# Patient Record
Sex: Female | Born: 1985 | Race: Black or African American | Hispanic: No | Marital: Single | Smoking: Former smoker
Health system: Southern US, Community
[De-identification: ages and names within clinical notes are randomized; demographics above are authoritative.]

## PROBLEM LIST (undated history)

## (undated) ENCOUNTER — Ambulatory Visit (HOSPITAL_COMMUNITY): Admission: EM | Payer: Self-pay

## (undated) ENCOUNTER — Inpatient Hospital Stay (HOSPITAL_COMMUNITY): Payer: Self-pay

## (undated) ENCOUNTER — Emergency Department: Payer: Self-pay | Source: Home / Self Care

## (undated) DIAGNOSIS — O98212 Gonorrhea complicating pregnancy, second trimester: Secondary | ICD-10-CM

## (undated) DIAGNOSIS — F17200 Nicotine dependence, unspecified, uncomplicated: Secondary | ICD-10-CM

## (undated) DIAGNOSIS — F1910 Other psychoactive substance abuse, uncomplicated: Secondary | ICD-10-CM

## (undated) DIAGNOSIS — F209 Schizophrenia, unspecified: Secondary | ICD-10-CM

## (undated) HISTORY — PX: BREAST ENHANCEMENT SURGERY: SHX7

## (undated) HISTORY — PX: OTHER SURGICAL HISTORY: SHX169

---
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
## 2007-10-06 ENCOUNTER — Ambulatory Visit (HOSPITAL_COMMUNITY): Admission: RE | Admit: 2007-10-06 | Discharge: 2007-10-06 | Payer: Self-pay | Admitting: Family Medicine

## 2007-10-25 ENCOUNTER — Emergency Department (HOSPITAL_COMMUNITY): Admission: EM | Admit: 2007-10-25 | Discharge: 2007-10-25 | Payer: Self-pay | Admitting: Emergency Medicine

## 2007-11-01 ENCOUNTER — Emergency Department (HOSPITAL_COMMUNITY): Admission: EM | Admit: 2007-11-01 | Discharge: 2007-11-01 | Payer: Self-pay | Admitting: Emergency Medicine

## 2007-11-04 ENCOUNTER — Emergency Department (HOSPITAL_COMMUNITY): Admission: EM | Admit: 2007-11-04 | Discharge: 2007-11-04 | Payer: Self-pay | Admitting: Emergency Medicine

## 2008-01-28 ENCOUNTER — Emergency Department (HOSPITAL_COMMUNITY): Admission: EM | Admit: 2008-01-28 | Discharge: 2008-01-28 | Payer: Self-pay | Admitting: Emergency Medicine

## 2008-02-02 ENCOUNTER — Other Ambulatory Visit: Admission: RE | Admit: 2008-02-02 | Discharge: 2008-02-02 | Payer: Self-pay | Admitting: Obstetrics and Gynecology

## 2008-05-04 ENCOUNTER — Emergency Department (HOSPITAL_COMMUNITY): Admission: EM | Admit: 2008-05-04 | Discharge: 2008-05-04 | Payer: Self-pay | Admitting: Emergency Medicine

## 2008-05-20 ENCOUNTER — Emergency Department (HOSPITAL_COMMUNITY): Admission: EM | Admit: 2008-05-20 | Discharge: 2008-05-21 | Payer: Self-pay | Admitting: Emergency Medicine

## 2009-08-18 ENCOUNTER — Emergency Department (HOSPITAL_COMMUNITY): Admission: EM | Admit: 2009-08-18 | Discharge: 2009-08-18 | Payer: Self-pay | Admitting: Emergency Medicine

## 2009-10-25 ENCOUNTER — Emergency Department (HOSPITAL_COMMUNITY): Admission: EM | Admit: 2009-10-25 | Discharge: 2009-10-25 | Payer: Self-pay | Admitting: Emergency Medicine

## 2010-01-01 ENCOUNTER — Emergency Department (HOSPITAL_COMMUNITY): Admission: EM | Admit: 2010-01-01 | Discharge: 2010-01-01 | Payer: Self-pay | Admitting: Emergency Medicine

## 2010-03-03 ENCOUNTER — Emergency Department (HOSPITAL_COMMUNITY): Admission: EM | Admit: 2010-03-03 | Discharge: 2010-03-03 | Payer: Self-pay | Admitting: Emergency Medicine

## 2010-05-07 ENCOUNTER — Emergency Department (HOSPITAL_COMMUNITY)
Admission: EM | Admit: 2010-05-07 | Discharge: 2010-05-08 | Disposition: A | Discharge status: Home / Self Care | Payer: Medicaid Other | Attending: Emergency Medicine | Admitting: Emergency Medicine

## 2010-05-07 DIAGNOSIS — N898 Other specified noninflammatory disorders of vagina: Secondary | ICD-10-CM | POA: Insufficient documentation

## 2010-06-17 LAB — URINALYSIS, ROUTINE W REFLEX MICROSCOPIC
Leukocytes, UA: NEGATIVE
Protein, ur: NEGATIVE mg/dL
Urobilinogen, UA: 0.2 mg/dL (ref 0.0–1.0)
pH: 6 (ref 5.0–8.0)

## 2010-06-17 LAB — CBC
HCT: 36.6 % (ref 36.0–46.0)
Hemoglobin: 13.1 g/dL (ref 12.0–15.0)
MCH: 32.1 pg (ref 26.0–34.0)
MCV: 89.7 fL (ref 78.0–100.0)
Platelets: 249 10*3/uL (ref 150–400)

## 2010-06-17 LAB — DIFFERENTIAL
Lymphocytes Relative: 43 % (ref 12–46)
Monocytes Relative: 7 % (ref 3–12)
Neutro Abs: 3.9 10*3/uL (ref 1.7–7.7)

## 2010-06-17 LAB — COMPREHENSIVE METABOLIC PANEL
AST: 14 U/L (ref 0–37)
Albumin: 3.4 g/dL — ABNORMAL LOW (ref 3.5–5.2)
Alkaline Phosphatase: 37 U/L — ABNORMAL LOW (ref 39–117)
BUN: 4 mg/dL — ABNORMAL LOW (ref 6–23)
Calcium: 8.5 mg/dL (ref 8.4–10.5)
Creatinine, Ser: 0.74 mg/dL (ref 0.4–1.2)
GFR calc Af Amer: >60 mL/min (ref >60)
GFR calc non Af Amer: >60 mL/min (ref >60)
Glucose, Bld: 76 mg/dL (ref 70–99)
Potassium: 3.2 mEq/L — ABNORMAL LOW (ref 3.5–5.1)
Sodium: 134 mEq/L — ABNORMAL LOW (ref 135–145)
Total Protein: 6.2 g/dL (ref 6.0–8.3)

## 2010-06-17 LAB — URINE MICROSCOPIC-ADD ON

## 2010-06-17 LAB — POCT PREGNANCY, URINE: Preg Test, Ur: POSITIVE

## 2010-06-21 LAB — COMPREHENSIVE METABOLIC PANEL
CO2: 25 mEq/L (ref 19–32)
Chloride: 107 mEq/L (ref 96–112)
GFR calc Af Amer: >60 mL/min (ref >60)
GFR calc non Af Amer: >60 mL/min (ref >60)
Sodium: 137 mEq/L (ref 135–145)
Total Protein: 7.1 g/dL (ref 6.0–8.3)

## 2010-06-21 LAB — DIFFERENTIAL
Basophils Absolute: 0.1 10*3/uL (ref 0.0–0.1)
Basophils Relative: 1 % (ref 0–1)
Eosinophils Absolute: 0.7 10*3/uL (ref 0.0–0.7)
Eosinophils Relative: 9 % — ABNORMAL HIGH (ref 0–5)
Lymphs Abs: 2.7 10*3/uL (ref 0.7–4.0)
Monocytes Absolute: 0.7 10*3/uL (ref 0.1–1.0)
Monocytes Relative: 9 % (ref 3–12)
Neutrophils Relative %: 46 % (ref 43–77)

## 2010-06-21 LAB — LIPASE, BLOOD: Lipase: 28 U/L (ref 11–59)

## 2010-06-21 LAB — URINALYSIS, ROUTINE W REFLEX MICROSCOPIC
Bilirubin Urine: NEGATIVE
Protein, ur: NEGATIVE mg/dL
Urobilinogen, UA: 0.2 mg/dL (ref 0.0–1.0)
pH: 6 (ref 5.0–8.0)

## 2010-06-21 LAB — CBC
HCT: 42.9 % (ref 36.0–46.0)
Hemoglobin: 14.4 g/dL (ref 12.0–15.0)
MCH: 31.7 pg (ref 26.0–34.0)
MCV: 94.1 fL (ref 78.0–100.0)
RDW: 13.9 % (ref 11.5–15.5)
WBC: 7.7 10*3/uL (ref 4.0–10.5)

## 2010-06-21 LAB — URINE CULTURE

## 2010-06-21 LAB — POCT PREGNANCY, URINE: Preg Test, Ur: NEGATIVE

## 2010-06-21 LAB — URINE MICROSCOPIC-ADD ON

## 2010-08-22 NOTE — Op Note (Signed)
NAMEDESTANEY, SARKIS           ACCOUNT NO.:  [id number]   MEDICAL RECORD NO.:  [id number]          PATIENT TYPE:  INP   LOCATION:  A401                          FACILITY:  [facility]   PHYSICIAN:  Langley Gauss, MD     DATE OF BIRTH:  Apr 11, 1985   DATE OF PROCEDURE:  09/02/2004  DATE OF DISCHARGE:                                 OPERATIVE REPORT   DIAGNOSES:  1.  A 39-week intrauterine pregnancy.  2.  Dysfunctional labor pattern requiring Pitocin augmentation secondary to      inadequate frequency and intensity of uterine contractions.  3.  Placement of continuous lumbar epidural analgesia.   PROCEDURE PERFORMED:  1.  Spontaneous-assisted vaginal delivery of a 6 pound, 7 ounce female      infant.  2.  Midline episiotomy and repair.   PHYSICIAN:  Langley Gauss, M.D.   SPECIMENS:  Arterial cord gas and cord blood to laboratory.  The placenta is  examined and noted to be apparently intact with a three-vessel umbilical  cord.   ESTIMATED BLOOD LOSS:  Less than 500 cc.   SUMMARY:  The patient had been scheduled for induction of labor on Sep 02, 2004, but she began having contractions in the late p.m. on Sep 01, 2004 and  presented to [facility] in the early a.m. of Sep 02, 2004.  She did  give a history of one episode of leakage of clear fluid at home, enough to  wet her mother's couch, but no additional leakage of fluid.  We did not  document rupture of membranes upon her presentation on Sep 02, 2004.  She  was observed throughout the evening and noted to have contraction pattern  every 3-7 minutes, somewhat mild in intensity.  In the a.m. of Sep 02, 2004,  an amniotomy was performed with fetal scalp electrodes unsuccessfully  placed.  Clear amniotic fluid is noted.  Subsequently with onset of active  labor pattern, the patient initially received IV Nubain and Phenergan, which  provided inadequate labor anesthesia; thus, she requested an epidural.  An  epidural was  placed without complications by Dr. Roylene Reason. Lisette Grinder, after  which time she was noted to be 5 cm dilated, completely effaced with a  vertex at a +1 station.  Subsequently had an excellent bilateral block in  place.  Progressed to complete dilatation, after which time she was placed  in a dorsal lithotomy position.  Prepped and draped in the usual sterile  manner.  Legs were completely numb, but she was able to push.  She pushed  well during the short second stage of labor.  With the stents in the  perineum, a small midline episiotomy was performed.  The infant was then  delivered in the direct OA position over the midline episiotomy without  extension.  Mouth and nares were bulb-suctioned of clear amniotic fluid.  Spontaneous rotation occurred through a right anterior shoulder position.  Gentle down-traction combined with expulsive efforts resulted in the  delivery of the infant's hands and shoulders beneath the pubic symphysis  without difficulty as well as the remainder of the  infant.  The umbilical  cord is then moved towards the infant.  The cord is doubly clamped and cut.  The infant is placed on the maternal abdomen for immediate bonding purposes.  Arterial cord gas and cord blood are then obtained.  Gentle traction on the  umbilical cord resulted in separation, which upon examination is noted to be  intact placenta with three-vessel umbilical cord.  Excellent uterine tone is  achieved by performing bimanual massage of the uterus.  Examination of the  following delivery reveals a midline episiotomy which has not extended.  This is easily repaired using 0 chromic in a running locked fashion on the  vaginal mucosa.  A two-layered closure of 0 chromic on the perineal body.  To restore the normal anatomy, the patient was then taken out of the dorsal  lithotomy position and rolled to her side, at which time the epidural  catheter was removed with the blue tip noted to be intact.  The  patient  herself is advised that the Foley catheter had been removed just prior to  delivery; thus, she should not have to get up for several hours to void.       DC/MEDQ  D:  09/02/2004  T:  09/02/2004  Job:  [id number]

## 2010-08-22 NOTE — Op Note (Signed)
NAMERAYELLE, ARMOR           ACCOUNT NO.:  [id number]   MEDICAL RECORD NO.:  [id number]          PATIENT TYPE:  INP   LOCATION:  LDR1                          FACILITY:  [facility]   PHYSICIAN:  Langley Gauss, MD     DATE OF BIRTH:  1986/03/24   DATE OF PROCEDURE:  09/02/2004  DATE OF DISCHARGE:                                 OPERATIVE REPORT   PROCEDURE:  Placement of continuous lumbar epidural analgesia at the L3-L4  interspace from performed by Dr. Roylene Reason. Lisette Grinder.   COMPLICATIONS:  None.   SUMMARY:  An appropriate informed consent was obtained.  The patient had  inadequate results from narcotics.  She requested epidural.  Risks and  benefits discussed with the patient.  The patient placed in a seated  position.  Bony landmarks were identified.  The L3-L4 interspace was chosen.  The patient's back is sterilely prepped and draped utilizing the epidural  tray, 5 mL of 1% lidocaine plain injected at the midline and the L3-L4  interspace to raise a small skin wheal.  A 17-gauge Tuohy-Schliff needle was  then utilized to loss resistance in an air-filled glass syringe to  atraumatically identify the epidural space on the first attempt without  difficulty.  Initial test dose 5 mL 1.5% lidocaine plus epinephrine injected  through the epidural needle.  No signs of CSF or intravascular injection  obtained.  Thus the epidural catheter was inserted to depth of 5 cm,  epidural needle was removed.  Aspiration test is negative.  Second test dose  2 mL 1.5% lidocaine plus epinephrine injected through the epidural catheter  itself.  Again no signs of CSF or intravascular injection obtained.  the  catheter is then secured in place.  The patient is returned to the lateral  supine position, at which time she does have evidence of a bilateral block  setting up.  The patient is thus connected to the infusion pump containing a  standard mixture.  She will be treated with a rate of 14 mL/hr.  Subsequently while watching the patient, she is noted to continued have  significant discomfort associated with the uterine contractions.  Thus she  received 5 mL of 2% lidocaine as a slowly-infused of bolus.  Examination  after this reveals cervix to be 5 cm dilated, +1 station and completely  effaced.  She continues to have a reassuring fetal heart rate.  Additional  leakage of clear amniotic fluid is noted with placement of the fetal scalp  electrode.       DC/MEDQ  D:  09/02/2004  T:  09/02/2004  Job:  [id number]

## 2010-08-22 NOTE — H&P (Signed)
NAMEJOBETH, [name]           ACCOUNT NO.:  [id number]   MEDICAL RECORD NO.:  [id number]          PATIENT TYPE:  INP   LOCATION:  LDR1                          FACILITY:  [facility]   PHYSICIAN:  Langley Gauss, MD     DATE OF BIRTH:  Sep 28, 1985   DATE OF ADMISSION:  09/02/2004  DATE OF DISCHARGE:  LH                                HISTORY & PHYSICAL   The patient is a 25 year old gravida 1, para 0 at [redacted] weeks gestation who had  scheduled induction of labor on Sep 03, 2004.  However, the late p.m. of Sep 01, 2004 patient contacted [facility] stating she began having  uterine contractions with tightening and loosening of the uterine wall.  She  was advised at that time to maintain modified bed rest and assess  contraction adequacy and frequency.  Notified that when contractions were  five minutes apart x2 hours' duration she should present to [facility].  Patient subsequently presented to [facility] at 0130 complaining of some  leakage of clear fluid.  This occurred on only one occasion and was only  enough to moisten a pillow.  She had no further leakage of fluid, no vaginal  bleeding following this.  Examination in labor and delivery at 0145 revealed  Nitrazine negative, vaginal discharge, no pooling of fluid, no leakage of  amniotic fluid.  Examination of cervix 1-2 cm dilated, 60% effaced, vertex  at a -1 station.  Initially on external toco patient is noted to have  uterine contractions anywhere from q.3 to q.7 minutes with a reassuring  fetal heart rate.   ASSESSMENT/PLAN:  Patient with scheduled induction of labor Sep 03, 2004 now  presents in early stages of active labor on Sep 02, 2004, thus she is  admitted early a.m. Sep 02, 2004.  Will proceed with amniotomy, therefore  augment or induce with Pitocin as clinically indicated.       DC/MEDQ  D:  09/02/2004  T:  09/02/2004  Job:  [id number]

## 2010-08-22 NOTE — Discharge Summary (Signed)
[name], [name]           ACCOUNT NO.:  [id number]   MEDICAL RECORD NO.:  [id number]          PATIENT TYPE:  INP   LOCATION:  A401                          FACILITY:  [facility]   PHYSICIAN:  Langley Gauss, MD     DATE OF BIRTH:  03-Mar-1986   DATE OF ADMISSION:  09/02/2004  DATE OF DISCHARGE:  06/01/2006LH                                 DISCHARGE SUMMARY   DATE OF EPIDURAL:  Sep 02, 2004.   DATE OF VAGINAL DELIVERY:  Sep 02, 2004.   DELIVERY FORM:  Spontaneous assisted vaginal delivery of a 6 pound 7 ounce  female infant, delivered over a midline episiotomy with repair.   The patient is given a copy of standard discharge instructions.  Followup in  the office in four weeks time.  She is bottle feeding at time of discharge.   DISCHARGE MEDICATIONS:  1.  Tylenol No. 3.  2.  Motrin for postpartum pain relief.   PERTINENT LABORATORY STUDIES:  Urine drug screen positive for THC, O  positive blood type, and admission hemoglobin/hematocrit 10.7/30.0 with a  white count of 8.4.  Postpartum day #1 9.8/28.1.   HOSPITAL COURSE:  The patient originally had induction of labor scheduled on  Sep 03, 2004; however, she presented very early a.m. about Sep 02, 2004  complaining of uterine contractions.  She was noted to be in prodromal  labor.  Amniotomy was performed.  Subsequently pitocin augmentation was  required.  She received  placement of epidural during the course of labor.  Progressed well along the labor curve.  Had an excellent bilateral block in  place, but when placed in the dorsal lithotomy position, she was able to  push well during the short second stage of labor.  Postpartum, the patient  has done well.  She has had no postpartum complications.  She has bonded  well with the infant.  She is bottle feeding.  She is discharged to home on  postpartum day #1-1/2.       DC/MEDQ  D:  09/04/2004  T:  09/04/2004  Job:  [id number]

## 2010-08-22 NOTE — Consult Note (Signed)
[name], Sandra           ACCOUNT NO.:  [id number]   MEDICAL RECORD NO.:  [id number]          PATIENT TYPE:  OIB   LOCATION:  A415                          FACILITY:  [facility]   PHYSICIAN:  Tilda Burrow, M.D. DATE OF BIRTH:  1985/09/15   DATE OF CONSULTATION:  06/01/2004  DATE OF DISCHARGE:  06/01/2004                                   CONSULTATION   OBSERVATION AND PROCEDURE NOTE:   CHIEF COMPLAINT:  Vulvar pain necessitating ambulance transport to hospital  at 24-1/2 weeks' gestation.   HISTORY OF PRESENT ILLNESS:  A 25 year old primiparous female, patient of  Langley Gauss, M.D., followed for pregnancy care through his office, who  presents via EMS transport after noticing left labial swelling and  discomfort this afternoon.  The patient is found on exam to have a large 3 x  2 x 2 cm cystic abscess structure in the interlabial crease on the left.  It  is just slightly anterior to the normal Bartholin's abscess position.  It is  felt to represent an atypical Bartholin's abscess or sebaceous cyst.  The  patient is otherwise fine but does complain of a mild whitish thick  discharge.   INCISION AND DRAINAGE:  The cyst is inspected.  Local infiltration of the  skin overlying it performed.  Efforts to see if it can be accessed through  from inside the hymen remnants reveal that it would not be possible to  access it.  After infiltrating with 3 mL of Xylocaine with epinephrine, a 15  blade stab incision was made in the inferiormost portion of the abscess.  A  small amount of purulence and thick, clumpy sebaceous material consistent  with a sebaceous cyst were encountered.  Inadequate drainage from the  inferior stab incision was achieved, so additional infiltration directly  over the interlabial crease on the left was performed and then an X-shaped  stab incision performed directly over the sebaceous cyst, which allowed  expression of large chunks of sebaceous debris.  A  one-half inch iodoform  wick was inserted into the site.  The patient was given Keflex 500 mg q.i.d.  x7 days and Tylenol No. 3 for pain and instructed to use ice pack p.r.n.,  follow-up 48 hours with Dr. Lisette Grinder for removal if the wick if it does not  spontaneously fall out before then.  Ice packs to be used for the first few  hours, then after that hot tub soaks and massage as much as tolerated by the  patient will be recommended.  Follow-up, as stated, in 48 hours.     JVF/MEDQ  D:  06/01/2004  T:  06/02/2004  Job:  [id number]   cc:   Langley Gauss, MD  [address]  [place]  [place] [address]  Fax: [phone]

## 2010-08-22 NOTE — Discharge Summary (Signed)
[name], [name]           ACCOUNT NO.:  [id number]   MEDICAL RECORD NO.:  [id number]          PATIENT TYPE:  OBV   LOCATION:  A415                          FACILITY:  [facility]   PHYSICIAN:  Langley Gauss, MD     DATE OF BIRTH:  03/29/1986   DATE OF ADMISSION:  05/14/2004  DATE OF DISCHARGE:  02/08/2006LH                                 DISCHARGE SUMMARY   This is a 25 year old gravida 1, para 0 [name] at  [redacted] weeks gestation who presents to [facility] with the complaint of  abdominal pain x3 days' duration and leaking fluid.  I was specifically  initially contacted by nursing staff on an urgent basis with the only  history obtained by them was that patient was leaking fluid vaginally, her  water was broke, Nitrazine was positive, and she had been examined by a  member of the nursing staff who noted bulging membranes and patient was  acting and grunting as though she felt pressure and wanted to push.   Patient does have a history of asthma.  Prior to my arrival she had received  an albuterol respiratory treatment and was on O2 and much calmer than upon  her initial presentation.   The complete history as obtained by myself is that patient was at home in  her high school drop-out usual unemployed status sitting on the couch.  Finally had the ambition with a full bladder to get up and pee but then she  specifically told me that she was unable to make it to the bathroom and  noted feeling very wet in her undergarments and the upper portion of her  blue jeans.  She states that when she sat down to urinate on the pot she  only had yellow mucousy discharge.  At that point in time her mother  contacted the office and stated that the patient was on her way to the  hospital leaking fluid.   REVIEW OF SYSTEMS:  Pertinent for no significant change in vaginal discharge  prior to this.  She has had three days of vague suprapubic abdominal pain,  but no  definite history of uterine contractions.  She denies any known STD  exposures.  She does describe good fetal movement.  Patient is able to  provide the history that she had an ultrasound done three weeks ago at which  time the baby was noted to be what she described as bleach presentation.  Patient has no idea what the implications and meaning of this at [redacted] weeks  gestation.  Otherwise, ultrasound was normal with normal anatomic survey.  Her prenatal course is reviewed.  She is noted to be O+ blood type.  AFP  triple screen within normal limits.  RPR nonreactive.  Hemoglobin 11.8,  hematocrit 35.5.  She is noted to have late onset of care, initial visit at  15+ weeks gestation at which time she was seen at [facility].   PAST MEDICAL HISTORY:  Pertinent for a history of asthma.  She uses an  inhaler.  She also has used Singulair previously and  has previously used  Clarinex.  Patient did stop Clarinex with findings of a positive UCG.  Patient does claim that she became pregnant with oral contraceptive failure  but I highly doubt that she was taking oral contraceptives appropriately.  Past medical history is otherwise negative.   ALLERGIES:  She does state she is allergic to PENICILLIN with unknown  reaction.   SOCIAL HISTORY:  High school drop-out.  Father of the baby is not involved.  Comes from a large [place] family here in local [place] area.  Presumably this was an unplanned pregnancy as patient contends that this was  oral contraceptive failure.   FAMILY HISTORY:  Noncontributory.   PHYSICAL EXAMINATION:  VITAL SIGNS:  Height 5 feet 8 inches, pre pregnancy  weight is 120.  Most recent weight is 136.  Patient's vital signs not seen  on intake triage sheet.  Last visit to the office blood pressure 107/57,  pulse rate of 80.  Today's visit to labor and delivery respiratory rate is  noted to be nonlabored with respirations 16-20.  Blood pressure on external   fetal monitor sheet 140/77, pulse of 86.  HEENT:  Negative.  NECK:  Supple.  Thyroid is nonpalpable.  LUNGS:  Clear.  CARDIOVASCULAR:  Regular rate and rhythm.  ABDOMEN:  Soft and nontender.  Gravid uterus identified with the fundal  height noted to be two fingerbreadths above the umbilicus.  EXTREMITIES:  Normal.  PELVIC:  Normal external genitalia.  There is noted to be moisture noted  which undoubtedly is due to a digital examination done previously and use of  lubricant.  External speculum examination performed by myself reveals cervix  to be closed, 4 cm long, no leakage of fluid, no vaginal bleeding.  No  significant heavy discharge is identified.  GC and Chlamydia cultures  performed from the endocervix.  Specimen taken from posterior vaginal  fornix.  Wet prep is performed as well as fern test.  External fetal monitor  reveals fetal heart rate baseline 140s.  No uterine activity is identified  other than maternal movement.  There was no evidence of any leakage of fluid  in the bed or by patient history since arrival here on labor and delivery.   LABORATORIES:  Urine drug test is performed which is positive for cocaine.  Of note, cocaine in the urine is positive for only two to three days  following use.  Patient is also noted to be positive for THC.  These results  will be discussed with the patient at follow-up visit to protect her  confidentiality as during the visit here in labor and delivery excess number  of family members are present in the room numbering four to six.  A limited  OB ultrasound greater than [redacted] weeks gestation is performed and interpreted  by Dr. Roylene Reason. Lisette Grinder which reveals a single intrauterine pregnancy,  viable.  Fetal cardiac activity is noted 150 beats per minute.  Double  footling breech presentation.  Normal amniotic fluid volume subjectively.  In addition, amniotic fluid index is obtained with pockets of 4.7, 4.4, 7.2, and 3 for a total of AFI of  19.  Growth parameters are all consistent with  22 weeks estimated gestational age.  Good fetal movement is identified.  Pertinently, double footling breech presentation is noted.  The patient's  bladder is noted to be slightly distended and fetal movement specifically  kicking is noted to directly kick on the patient's bladder.  The GC and  Chlamydia  cultures are currently pending.  The wet prep is visualized which  reveals presence of __________ Trichomonas.   ASSESSMENT/PLAN:  1.  Urinary incontinence as result of the pregnancy.  2.  Trichomonas vaginitis.  3.  Cocaine use disorder.   PLAN:  Patient is given prescription for Flagyl 500 mg p.o. b.i.d. x7 days  to treat the Trichomonas.  She is advised to get dressed and ambulate here  at [facility] and notify us if any leakage of fluid does occur.  Discussed  with her the urinary leakage and efforts should be made by her to avoid  hyperdistention of the bladder in light of the double footling breech  presentation which could result in additional urinary leakage.  Signs and  symptoms of labor as well  as spontaneous rupture of membranes reviewed with the patient.  She is  advised to keep her next prenatal visit and as stated previously, to protect  her confidentiality and her current medical status, the positive cocaine and  THC will be discussed in the absence of other family members.  Patient is  discharged home on today's date, May 14, 2004.      DC/MEDQ  D:  05/14/2004  T:  05/15/2004  Job:  [id number]
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
## 2010-08-22 NOTE — H&P (Signed)
NAMESHARLENE, [name]           ACCOUNT NO.:  [id number]   MEDICAL RECORD NO.:  [id number]          PATIENT TYPE:  INP   LOCATION:  LDR1                          FACILITY:  [facility]   PHYSICIAN:  Langley Gauss, MD     DATE OF BIRTH:  20-May-1985   DATE OF ADMISSION:  DATE OF DISCHARGE:  LH                                HISTORY & PHYSICAL   The date of scheduled induction is __________.  The patient is to come at  0800.  We will proceed with amniotomy, thereafter augmentor-induced labor as  clinically indicated.  The patient is a 24 year old gravida 1, para 0 at 61-  1/[redacted] weeks gestation, currently receiving homebound care through [employer].  The cervix is favorable for induction, and we will induce her  for psychosocial factors.  In addition, she is a chronic asthmatic,  requiring fairly aggressive treatment during this time of the year.   PRENATAL COURSE:  She is known to be O positive blood type.  RPR is  nonreactive, rubella immune.  AFP triple screen is within normal limits.  She has had serial ultrasounds which have documented adequate fetal growth,  most recently Aug 08, 2004 at 34-6/[redacted] weeks gestation.  Ultrasound revealed  appropriate interval growth compared to previous ultrasounds, normal  amniotic fluid volume and good fetal movement.  The patient did present late  for prenatal care with initial visit past 15 weeks.   PAST MEDICAL HISTORY:  She does have asthma.  She uses an inhaler.  Previously she has utilized both [employer] fast streamer.  She did stop using  Clarinex with a positive pregnancy test but was advised that this would be  an acceptable medication to be utilized during the pregnancy.  She states  she is allergic to penicillin with an unknown reaction.   SOCIAL HISTORY:  The father of the baby is uninvolved.  The patient is a  nonsmoker.  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  Home phone number is  [phone].   PHYSICAL EXAMINATION:  GENERAL APPEARANCE:  A pleasant black female with  height 5 feet 8 inches, 120 pounds, 145 pounds  most recently.  VITAL SIGNS:  Blood pressure 102/59, pulse rate 80, respiratory rate 20.  HEENT:  Negative.  No adenopathy.  NECK:  Supple.  Thyroid is nonpalpable.  LUNGS:  Clear.  CARDIOVASCULAR:  Exam has regular rate and rhythm.  ABDOMEN:  Soft and nontender.  No surgical scars are identified.  She does  have protuberance of the umbilicus.  Vertex presentation by [employer].  Fundal height of 34 cm.  EXTREMITIES:  Are noted to be normal.  PELVIC:  Normal external genitalia.  No lesions or ulcerations are  identified.  Cervix is 2 cm dilated.  Vertex is zero station and about 60%  effaced.  The vertex is easily palpable.  The patient had complaint of 3  days' duration of clear fluid running down her leg.  She did not notify  anyone regarding these concerns.  On examination today she is noted to have  a bubbly  discharge present which is visible, but no amniotic fluid is noted.  GC and chlamydia cultures are performed as well as a wet prep.   ASSESSMENT:  A 39-week intrauterine pregnancy, medically indicated induction  of labor with a very favorable cervix.   The patient is to be referred, at which time amniotomy will be performed.  Subsequently she will receive Pitocin augmentation or induction as  clinically indicated.       DC/MEDQ  D:  08/28/2004  T:  08/28/2004  Job:  [id number]

## 2011-01-02 LAB — WET PREP, GENITAL: Yeast Wet Prep HPF POC: NONE SEEN

## 2011-01-02 LAB — DIFFERENTIAL
Basophils Absolute: 0.1
Basophils Relative: 1
Eosinophils Relative: 4
Lymphocytes Relative: 44
Neutro Abs: 2.6

## 2011-01-02 LAB — URINALYSIS, ROUTINE W REFLEX MICROSCOPIC
Bilirubin Urine: NEGATIVE
Ketones, ur: NEGATIVE
Nitrite: NEGATIVE
Protein, ur: NEGATIVE
Urobilinogen, UA: 0.2

## 2011-01-02 LAB — BASIC METABOLIC PANEL
BUN: 4 — ABNORMAL LOW
Calcium: 9.2
GFR calc non Af Amer: >60
Glucose, Bld: 92

## 2011-01-02 LAB — CBC
MCHC: 33.4
Platelets: 292
RDW: 15

## 2011-01-02 LAB — PREGNANCY, URINE: Preg Test, Ur: NEGATIVE

## 2011-01-24 ENCOUNTER — Emergency Department (HOSPITAL_COMMUNITY)
Admission: EM | Admit: 2011-01-24 | Discharge: 2011-01-24 | Disposition: A | Discharge status: Home / Self Care | Payer: Medicaid Other | Attending: Emergency Medicine | Admitting: Emergency Medicine

## 2011-01-24 ENCOUNTER — Encounter: Payer: Self-pay | Admitting: *Deleted

## 2011-01-24 DIAGNOSIS — L089 Local infection of the skin and subcutaneous tissue, unspecified: Secondary | ICD-10-CM | POA: Insufficient documentation

## 2011-01-24 DIAGNOSIS — L7682 Other postprocedural complications of skin and subcutaneous tissue: Secondary | ICD-10-CM

## 2011-01-24 DIAGNOSIS — Z87891 Personal history of nicotine dependence: Secondary | ICD-10-CM | POA: Insufficient documentation

## 2011-01-24 DIAGNOSIS — R209 Unspecified disturbances of skin sensation: Secondary | ICD-10-CM | POA: Insufficient documentation

## 2011-01-24 NOTE — ED Provider Notes (Signed)
History   This chart was scribed for Felisa Bonier, MD by Clarita Crane. The patient was seen in room APA12/APA12 and the patient's care was started at 10:25AM.   CSN: [id number] Arrival date & time: 01/24/2011  9:53 AM   First MD Initiated Contact with Patient 01/24/11 [phone]      Chief Complaint  Patient presents with  . Recurrent Skin Infections    HPI [name] is a 25 y.o. female who presents to the Emergency Department complaining of area of moderate inflammation described as a boil to right axillary region onset 5 days ago and persistent since with associated pain. Patient reports having bilateral breast augmentation surgery performed recently and was started on abx. Patient denies drainage from area of inflammation. Patient states that she has an appointment with plastic surgeon 2 days from now  History reviewed. No pertinent past medical history.  Past Surgical History  Procedure Date  . Breat implants     History reviewed. No pertinent family history.  History  Substance Use Topics  . Smoking status: Former Games developer  . Smokeless tobacco: Not on file  . Alcohol Use: No    OB History    Grav Para Term Preterm Abortions TAB SAB Ect Mult Living                  Review of Systems 10 Systems reviewed and are negative for acute change except as noted in the HPI.  Allergies  Penicillins  Home Medications  No current outpatient prescriptions on file.  BP 119/77  Pulse 103  Temp(Src) 97.2 F (36.2 C) (Oral)  Resp 20  Ht 5\' 9"  (1.753 m)  Wt 135 lb (61.236 kg)  BMI 19.94 kg/m2  SpO2 100%  LMP 01/05/2011  Physical Exam  Nursing note and vitals reviewed. Constitutional: She is oriented to person, place, and time. She appears well-developed and well-nourished. No distress.  HENT:  Head: Normocephalic and atraumatic.  Eyes: EOM are normal. Pupils are equal, round, and reactive to light.  Neck: Neck supple. No tracheal deviation present.    Cardiovascular: Normal rate.   Pulmonary/Chest: Effort normal. No respiratory distress.       Well healing incision in right axilla with underlying swelling but no overlying erythema or drainage to suggest infection. No abscess to drain.   Abdominal: She exhibits no distension.  Musculoskeletal: Normal range of motion. She exhibits no edema.  Neurological: She is alert and oriented to person, place, and time. No sensory deficit.  Skin: Skin is warm and dry.  Psychiatric: She has a normal mood and affect. Her behavior is normal.    ED Course  Procedures (including critical care time)  DIAGNOSTIC STUDIES: Oxygen Saturation is 100% on room air, normal by my interpretation.    COORDINATION OF CARE:    Labs Reviewed - No data to display No results found.   No diagnosis found.    MDM  The patient appears to have some mild inflammation at the incision site from her recent breast implant at the right axilla. There is no erythema overlying it, and I think this may just be true and innocent inflammation from the suture site. At this time I am not inclined to make an incision to try and drain any abscess, as I don't think there is abscess there. The swelling is firm and not fluctuant and there is no overlying erythema to suggest infection. Furthermore, the patient is on an antibiotics prescribed by the surgeon who performed  the procedure, and she is due to followup with that surgeon in 2 days regarding the swelling in the right axilla. I will defer further evaluation and treatment to the surgeon who is treating her. The patient states her understanding of and agreement with this plan of care.     I personally performed the services described in this documentation, which was scribed in my presence. The recorded information has been reviewed and considered.    Felisa Bonier, MD 01/24/11 [phone]

## 2011-01-24 NOTE — ED Notes (Signed)
Pt c/o boil to under right arm x 5 days ago

## 2011-01-25 ENCOUNTER — Emergency Department (HOSPITAL_COMMUNITY)
Admission: EM | Admit: 2011-01-25 | Discharge: 2011-01-25 | Disposition: A | Discharge status: Home / Self Care | Payer: Medicaid Other | Attending: Emergency Medicine | Admitting: Emergency Medicine

## 2011-01-25 ENCOUNTER — Encounter (HOSPITAL_COMMUNITY): Payer: Self-pay

## 2011-01-25 DIAGNOSIS — Z978 Presence of other specified devices: Secondary | ICD-10-CM | POA: Insufficient documentation

## 2011-01-25 DIAGNOSIS — Z87891 Personal history of nicotine dependence: Secondary | ICD-10-CM | POA: Insufficient documentation

## 2011-01-25 DIAGNOSIS — N644 Mastodynia: Secondary | ICD-10-CM | POA: Insufficient documentation

## 2011-01-25 NOTE — ED Notes (Signed)
Had breast implants oct.11, was seen yesterday for ?boil to right breast, wants area rechecked, "stitches swollen", "implant feels bigger"

## 2011-01-25 NOTE — ED Provider Notes (Signed)
Scribed for Donnetta Hutching, MD, the patient was seen in room APA02/APA02 . This chart was scribed by Ellie Lunch.   CSN: [id number] Arrival date & time: 01/25/2011  8:18 PM   First MD Initiated Contact with Patient 01/25/11 2046      Chief Complaint  Patient presents with  . Wound Check    had breast implants oct.11    (Consider location/radiation/quality/duration/timing/severity/associated sxs/prior treatment) HPI [name] is a 25 y.o. female who presents to the Emergency Department for a wound check. Pt received breast implants 01/15/2011 from Dr. Ellery Plunk in [place] and is concerned that she may have an infection. Pt was seen in ED yesterday for swelling at incision site and swelling in inferior aspect of right breast. Pt discharged and returned today for additional swelling in inferior aspect of right breast. Pt also reports increased pain on right side, but notes that additional swelling is non tender. Also c/o subjective fever. Denies soreness in left breast  Pt has follow up with surgeon tomorrow. Pt is on antibiotic.   History reviewed. No pertinent past medical history.  Past Surgical History  Procedure Date  . Breat implants     No family history on file.  History  Substance Use Topics  . Smoking status: Former Games developer  . Smokeless tobacco: Not on file  . Alcohol Use: No    Review of Systems 10 Systems reviewed and are negative for acute change except as noted in the HPI.  Allergies  Penicillins  Home Medications   Current Outpatient Rx  Name Route Sig Dispense Refill  . CEPHALEXIN 500 MG PO CAPS Oral Take 1,000 mg by mouth 4 (four) times daily. Patient started antibiotic on 01/22/11, should be a 5 day regimen       BP 118/63  Pulse 90  Temp(Src) 99.2 F (37.3 C) (Oral)  Resp 16  Ht 5\' 9"  (1.753 m)  Wt 135 lb (61.236 kg)  BMI 19.94 kg/m2  SpO2 100%  LMP 01/24/2011  Physical Exam  Nursing note and vitals reviewed. Constitutional: She is  oriented to person, place, and time. She appears well-developed and well-nourished.  HENT:  Head: Normocephalic and atraumatic.  Eyes: Conjunctivae and EOM are normal.  Neck: Normal range of motion.  Cardiovascular: Normal rate and regular rhythm.   Pulmonary/Chest: Effort normal. No respiratory distress.        Asymmetry of two breasts.  Inferior aspect of r. breast non tender. No erythema . No sign of cellulitis. Area of induration in right axilla approximately 0.75 x 2 CM. Patient says this has not changed in character in past 24 hour  Abdominal: Soft. There is no tenderness.  Musculoskeletal: Normal range of motion.  Neurological: She is alert and oriented to person, place, and time.  Skin: Skin is warm and dry.  Psychiatric: She has a normal mood and affect.   ED Course  Procedures (including critical care time)  OTHER DATA REVIEWED: Nursing notes, vital signs, and past medical records reviewed.  DIAGNOSTIC STUDIES: Oxygen Saturation is 100% on room air, normal by my interpretation.    1. Breast pain     9:12 PM EDP at PT bedside. Discussed with PT PE findings. Swelling to inferior aspect of right breast is non tender with no erythema or signs of cellulitis. Discussed that this does not indicate infection. Discussed plan to discharge with instructions to continue taking abx and follow up with Surgeon at tomorrow's appointment.   MDM  Right breast shows no evidence  of infection. Patient points to inferior pole, however there is no tenderness or erythema.  Perhaps a small amount of edema.  She is taking cephalexin. No obvious pus pocket. Has appointment with plastic surgeon tomorrow.  I personally performed the services described in this documentation, which was scribed in my presence. The recorded information has been reviewed and considered.         Donnetta Hutching, MD 01/25/11 2117

## 2011-03-18 ENCOUNTER — Encounter (HOSPITAL_COMMUNITY): Payer: Self-pay | Admitting: *Deleted

## 2011-03-18 ENCOUNTER — Emergency Department (HOSPITAL_COMMUNITY)
Admission: EM | Admit: 2011-03-18 | Discharge: 2011-03-18 | Disposition: A | Discharge status: Home / Self Care | Payer: Medicaid Other | Attending: Emergency Medicine | Admitting: Emergency Medicine

## 2011-03-18 DIAGNOSIS — Q839 Congenital malformation of breast, unspecified: Secondary | ICD-10-CM

## 2011-03-18 DIAGNOSIS — N649 Disorder of breast, unspecified: Secondary | ICD-10-CM | POA: Insufficient documentation

## 2011-03-18 DIAGNOSIS — Z978 Presence of other specified devices: Secondary | ICD-10-CM | POA: Insufficient documentation

## 2011-03-18 DIAGNOSIS — Z87891 Personal history of nicotine dependence: Secondary | ICD-10-CM | POA: Insufficient documentation

## 2011-03-18 NOTE — ED Notes (Signed)
Pt reports feeling like she has a leak in her implants.  Reports feeling like fluid is moving across top part of chest. First noticed sensation yesterday.

## 2011-03-18 NOTE — ED Provider Notes (Signed)
History     CSN: [id number] Arrival date & time: 03/18/2011  5:31 AM   None     No chief complaint on file.   (Consider location/radiation/quality/duration/timing/severity/associated sxs/prior treatment) The history is provided by the patient.   patient is a 25 year old female status post saline breast implants by plastic surgery in [place] in October. Patient presents was current concern for saline leakage from the right breast area into subcutaneous tissue. Denies any change in size of the implant. No other complaints.  Past Medical History  Diagnosis Date  . Asthma     Past Surgical History  Procedure Date  . Breat implants   . Breast enhancement surgery     History reviewed. No pertinent family history.  History  Substance Use Topics  . Smoking status: Former Games developer  . Smokeless tobacco: Not on file  . Alcohol Use: No    OB History    Grav Para Term Preterm Abortions TAB SAB Ect Mult Living                  Review of Systems  Constitutional: Negative for fever.  HENT: Negative for neck pain.   Eyes: Negative for redness.  Respiratory: Negative for cough and shortness of breath.   Cardiovascular: Negative for chest pain.  Gastrointestinal: Negative for abdominal pain.  Genitourinary: Negative for dysuria.  Musculoskeletal: Negative for back pain.  Skin: Negative for rash.  Neurological: Negative for headaches.    Allergies  Penicillins  Home Medications   Current Outpatient Rx  Name Route Sig Dispense Refill  . CEPHALEXIN 500 MG PO CAPS Oral Take 1,000 mg by mouth 4 (four) times daily. Patient started antibiotic on 01/22/11, should be a 5 day regimen       BP 117/73  Pulse 76  Temp 97.3 F (36.3 C)  Resp 20  Ht 5\' 9"  (1.753 m)  Wt 140 lb (63.504 kg)  BMI 20.67 kg/m2  SpO2 100%  LMP 02/18/2011  Physical Exam  Nursing note and vitals reviewed. Constitutional: She is oriented to person, place, and time. She appears well-developed and  well-nourished.  HENT:  Head: Normocephalic and atraumatic.  Eyes: Conjunctivae and EOM are normal. Pupils are equal, round, and reactive to light.  Neck: Normal range of motion. Neck supple.  Cardiovascular: Normal rate, regular rhythm and normal heart sounds.   Pulmonary/Chest: Effort normal and breath sounds normal. She exhibits no tenderness.       Bilateral breast implants unable to tell if is any subcutaneous saline leaking from the right breast on physical exam is not apparent if it is at all  Abdominal: Soft. Bowel sounds are normal. There is no tenderness.  Musculoskeletal: Normal range of motion.  Neurological: She is alert and oriented to person, place, and time. No cranial nerve deficit. She exhibits normal muscle tone. Coordination normal.  Skin: Skin is warm. No rash noted. No erythema.    ED Course  Procedures (including critical care time)  Labs Reviewed - No data to display No results found.   1. Breast anomaly       MDM   Status post saline breast implants plastic surgery in [place] on October. Patient presents this morning with concern of leakage from the right breast area. Leakage is internal if at all. On examination unable to determine if there is any saline leakage patient has been referred back to her plastic surgeon in [place] for further evaluate.         Shelda Jakes,  MD 03/18/11 [phone]

## 2011-03-18 NOTE — ED Notes (Signed)
No leakage noted from breasts. Patient states it feels like there is a leak

## 2011-03-20 ENCOUNTER — Emergency Department (HOSPITAL_COMMUNITY)
Admission: EM | Admit: 2011-03-20 | Discharge: 2011-03-20 | Disposition: A | Discharge status: Home / Self Care | Payer: Medicaid Other

## 2011-03-20 NOTE — ED Notes (Signed)
Per registration - pt left stating she will go to her PCP.  Pt signed paper copy of AMA.  Placed in chart.

## 2011-10-20 ENCOUNTER — Emergency Department (HOSPITAL_COMMUNITY)
Admission: EM | Admit: 2011-10-20 | Discharge: 2011-10-20 | Disposition: A | Discharge status: Home / Self Care | Payer: Medicaid Other | Attending: Emergency Medicine | Admitting: Emergency Medicine

## 2011-10-20 ENCOUNTER — Encounter (HOSPITAL_COMMUNITY): Payer: Self-pay

## 2011-10-20 ENCOUNTER — Emergency Department (HOSPITAL_COMMUNITY): Payer: Medicaid Other

## 2011-10-20 DIAGNOSIS — S63619A Unspecified sprain of unspecified finger, initial encounter: Secondary | ICD-10-CM

## 2011-10-20 DIAGNOSIS — S6990XA Unspecified injury of unspecified wrist, hand and finger(s), initial encounter: Secondary | ICD-10-CM | POA: Insufficient documentation

## 2011-10-20 DIAGNOSIS — F17200 Nicotine dependence, unspecified, uncomplicated: Secondary | ICD-10-CM | POA: Insufficient documentation

## 2011-10-20 DIAGNOSIS — W19XXXA Unspecified fall, initial encounter: Secondary | ICD-10-CM | POA: Insufficient documentation

## 2011-10-20 NOTE — ED Notes (Signed)
Pt reports fell yesterday and c/o pain in r little finger.

## 2011-10-20 NOTE — ED Provider Notes (Signed)
History    This chart was scribed for [name], MD, MD by Smitty Pluck. The patient was seen in room APA03 and the patient's care was started at 7:36AM.    CSN: [id number]  Arrival date & time 10/20/11  4098   First MD Initiated Contact with Patient 10/20/11 0715      Chief Complaint  Patient presents with  . Finger Injury    (Consider location/radiation/quality/duration/timing/severity/associated sxs/prior treatment) Patient is a 26 y.o. female presenting with hand pain. The history is provided by the patient. No language interpreter was used.  Hand Pain This is a new problem. The current episode started yesterday. The problem occurs constantly. The problem has not changed since onset.Pertinent negatives include no chest pain and no abdominal pain. The symptoms are aggravated by bending.   JAKELINE DAVE is a 26 y.o. female who presents to the Emergency Department complaining of moderate right 5th finger injury onset 1 day. Reports moderate pain. Pt reports that she fell on injured her finger 1 day ago. Symptoms have been constant without radiation. Denies any other pain. Denies n/v/d.   Past Medical History  Diagnosis Date  . Asthma     Past Surgical History  Procedure Date  . Breat implants   . Breast enhancement surgery     No family history on file.  History  Substance Use Topics  . Smoking status: Current Everyday Smoker  . Smokeless tobacco: Not on file  . Alcohol Use: No    OB History    Grav Para Term Preterm Abortions TAB SAB Ect Mult Living                  Review of Systems  Cardiovascular: Negative for chest pain.  Gastrointestinal: Negative for abdominal pain.  All other systems reviewed and are negative.   10 Systems reviewed and all are negative for acute change except as noted in the HPI.   Allergies  Penicillins  Home Medications   Current Outpatient Rx  Name Route Sig Dispense Refill  . CEPHALEXIN 500 MG PO CAPS Oral Take  1,000 mg by mouth 4 (four) times daily. Patient started antibiotic on 01/22/11, should be a 5 day regimen       BP 108/69  Pulse 51  Temp 97.5 F (36.4 C) (Oral)  Resp 18  Ht 5\' 9"  (1.753 m)  Wt 119 lb (53.978 kg)  BMI 17.57 kg/m2  SpO2 100%  LMP 10/18/2011  Physical Exam  Nursing note and vitals reviewed. Constitutional: She is oriented to person, place, and time. She appears well-developed and well-nourished. No distress.  HENT:  Head: Normocephalic and atraumatic.  Eyes: Conjunctivae are normal.  Neck: No tracheal deviation present.  Cardiovascular:  No murmur heard. Musculoskeletal: Normal range of motion.       DIP of 5th right finger is swollen   Neurological: She is alert and oriented to person, place, and time.  Skin: Skin is warm and dry.  Psychiatric: She has a normal mood and affect. Her behavior is normal.    ED Course  Procedures (including critical care time) DIAGNOSTIC STUDIES: Oxygen Saturation is 100% on room air, normal by my interpretation.    COORDINATION OF CARE: 7:40AM EDP discusses pt ED treatment with pt     Labs Reviewed - No data to display Dg Finger Little Right  10/20/2011  *RADIOLOGY REPORT*  Clinical Data: Pain secondary to trauma.  RIGHT LITTLE FINGER 2+V  Comparison: None.  Findings: No fracture,  dislocation, or other abnormality.  IMPRESSION: Normal exam.  Original Report Authenticated By: Gwynn Burly, M.D.     No diagnosis found.    MDM    The chart was scribed for me under my direct supervision.  I personally performed the history, physical, and medical decision making and all procedures in the evaluation of this patient.[name], MD 10/20/11 [phone]

## 2011-11-03 ENCOUNTER — Encounter (HOSPITAL_COMMUNITY): Payer: Self-pay | Admitting: Emergency Medicine

## 2011-11-03 ENCOUNTER — Emergency Department (HOSPITAL_COMMUNITY)
Admission: EM | Admit: 2011-11-03 | Discharge: 2011-11-03 | Disposition: A | Discharge status: Home / Self Care | Payer: No Typology Code available for payment source | Attending: Emergency Medicine | Admitting: Emergency Medicine

## 2011-11-03 DIAGNOSIS — Y93I9 Activity, other involving external motion: Secondary | ICD-10-CM | POA: Insufficient documentation

## 2011-11-03 DIAGNOSIS — S5012XA Contusion of left forearm, initial encounter: Secondary | ICD-10-CM

## 2011-11-03 DIAGNOSIS — F17200 Nicotine dependence, unspecified, uncomplicated: Secondary | ICD-10-CM | POA: Insufficient documentation

## 2011-11-03 DIAGNOSIS — Y998 Other external cause status: Secondary | ICD-10-CM | POA: Insufficient documentation

## 2011-11-03 DIAGNOSIS — S5010XA Contusion of unspecified forearm, initial encounter: Secondary | ICD-10-CM | POA: Insufficient documentation

## 2011-11-03 MED ORDER — IBUPROFEN 400 MG PO TABS
600.0000 mg | ORAL_TABLET | Freq: Once | ORAL | Status: AC
  Administered 2011-11-03: 600 mg via ORAL
  Filled 2011-11-03: qty 2

## 2011-11-03 NOTE — ED Provider Notes (Signed)
Medical screening examination/treatment/procedure(s) were performed by non-physician practitioner and as supervising physician I was immediately available for consultation/collaboration.   Charles B. Bernette Mayers, MD 11/03/11 2348
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
## 2011-11-03 NOTE — ED Notes (Signed)
R. Miller, PA at bedside  
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
## 2011-11-04 ENCOUNTER — Encounter (HOSPITAL_COMMUNITY): Payer: Self-pay | Admitting: Emergency Medicine

## 2011-11-04 ENCOUNTER — Emergency Department (HOSPITAL_COMMUNITY)
Admission: EM | Admit: 2011-11-04 | Discharge: 2011-11-04 | Disposition: A | Discharge status: Home / Self Care | Payer: No Typology Code available for payment source | Attending: Emergency Medicine | Admitting: Emergency Medicine

## 2011-11-04 ENCOUNTER — Emergency Department (HOSPITAL_COMMUNITY): Payer: No Typology Code available for payment source

## 2011-11-04 DIAGNOSIS — F17200 Nicotine dependence, unspecified, uncomplicated: Secondary | ICD-10-CM | POA: Insufficient documentation

## 2011-11-04 DIAGNOSIS — S93401A Sprain of unspecified ligament of right ankle, initial encounter: Secondary | ICD-10-CM

## 2011-11-04 DIAGNOSIS — X58XXXA Exposure to other specified factors, initial encounter: Secondary | ICD-10-CM | POA: Insufficient documentation

## 2011-11-04 DIAGNOSIS — S93409A Sprain of unspecified ligament of unspecified ankle, initial encounter: Secondary | ICD-10-CM | POA: Insufficient documentation
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
## 2011-11-04 NOTE — ED Notes (Signed)
Pt stable at discharge and ambulatory 

## 2011-11-04 NOTE — ED Notes (Signed)
Pt c/o L foot pain; slight swelling noted to r foot area; no abrasions or bruising

## 2011-11-09 NOTE — ED Provider Notes (Signed)
Medical screening examination/treatment/procedure(s) were performed by non-physician practitioner and as supervising physician I was immediately available for consultation/collaboration.  Nicoletta Dress. Colon Branch, MD 11/09/11 [phone]

## 2012-08-01 ENCOUNTER — Encounter (HOSPITAL_COMMUNITY): Payer: Self-pay | Admitting: *Deleted

## 2012-08-01 ENCOUNTER — Emergency Department (HOSPITAL_COMMUNITY)
Admission: EM | Admit: 2012-08-01 | Discharge: 2012-08-01 | Disposition: A | Discharge status: Home / Self Care | Payer: Medicaid Other | Attending: Emergency Medicine | Admitting: Emergency Medicine

## 2012-08-01 DIAGNOSIS — Y92410 Unspecified street and highway as the place of occurrence of the external cause: Secondary | ICD-10-CM | POA: Insufficient documentation

## 2012-08-01 DIAGNOSIS — T148XXA Other injury of unspecified body region, initial encounter: Secondary | ICD-10-CM

## 2012-08-01 DIAGNOSIS — Z9889 Other specified postprocedural states: Secondary | ICD-10-CM | POA: Insufficient documentation

## 2012-08-01 DIAGNOSIS — IMO0002 Reserved for concepts with insufficient information to code with codable children: Secondary | ICD-10-CM | POA: Insufficient documentation

## 2012-08-01 DIAGNOSIS — J45901 Unspecified asthma with (acute) exacerbation: Secondary | ICD-10-CM | POA: Insufficient documentation

## 2012-08-01 DIAGNOSIS — Z79899 Other long term (current) drug therapy: Secondary | ICD-10-CM | POA: Insufficient documentation

## 2012-08-01 DIAGNOSIS — F17200 Nicotine dependence, unspecified, uncomplicated: Secondary | ICD-10-CM | POA: Insufficient documentation

## 2012-08-01 DIAGNOSIS — Z880 Allergy status to penicillin: Secondary | ICD-10-CM | POA: Insufficient documentation

## 2012-08-01 DIAGNOSIS — S4980XA Other specified injuries of shoulder and upper arm, unspecified arm, initial encounter: Secondary | ICD-10-CM | POA: Insufficient documentation

## 2012-08-01 DIAGNOSIS — S46909A Unspecified injury of unspecified muscle, fascia and tendon at shoulder and upper arm level, unspecified arm, initial encounter: Secondary | ICD-10-CM | POA: Insufficient documentation

## 2012-08-01 DIAGNOSIS — M549 Dorsalgia, unspecified: Secondary | ICD-10-CM

## 2012-08-01 DIAGNOSIS — Y939 Activity, unspecified: Secondary | ICD-10-CM | POA: Insufficient documentation

## 2012-08-01 MED ORDER — METHYLPREDNISOLONE SODIUM SUCC 125 MG IJ SOLR
125.0000 mg | Freq: Once | INTRAMUSCULAR | Status: AC
  Administered 2012-08-01: 125 mg via INTRAMUSCULAR

## 2012-08-01 MED ORDER — METHYLPREDNISOLONE SODIUM SUCC 125 MG IJ SOLR
125.0000 mg | Freq: Once | INTRAMUSCULAR | Status: DC
  Filled 2012-08-01: qty 2

## 2012-08-01 MED ORDER — DIAZEPAM 5 MG PO TABS: 5.0000 mg | ORAL_TABLET | Freq: Three times a day (TID) | ORAL | Status: DC

## 2012-08-01 MED ORDER — DICLOFENAC SODIUM 75 MG PO TBEC: 75.0000 mg | DELAYED_RELEASE_TABLET | Freq: Two times a day (BID) | ORAL | Status: AC
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
## 2012-08-01 NOTE — ED Notes (Signed)
Patient with no complaints at this time. Respirations even and unlabored. Skin warm/dry. Discharge instructions reviewed with patient at this time. Patient given opportunity to voice concerns/ask questions. Patient discharged at this time and left Emergency Department with steady gait.   

## 2012-08-01 NOTE — ED Notes (Signed)
Initial breast surgery 01/2011, 2nd sx 11/2011, breast lift sx was 06/2012.  C/O bilateral trapezius, mid thoracic, and bilateral lower back pain.  It is constant ache w/occasional sharp flares.  C/O some SOB, lightheadedness, extreme fatigue and night sweats.

## 2012-08-01 NOTE — ED Notes (Signed)
Back pain ,shoulders hurt, feels sleepy "all the time".  Tired all the time.  Felt this way since had breast implants 7 weeks ago" Had 3 cosmetic surgeries on her breasts.

## 2012-08-02 NOTE — ED Provider Notes (Signed)
Medical screening examination/treatment/procedure(s) were performed by non-physician practitioner and as supervising physician I was immediately available for consultation/collaboration.   Charles B. Bernette Mayers, MD 08/02/12 [phone]

## 2013-04-06 NOTE — L&D Delivery Note (Signed)
Delivery Note  Called to patient's room due to increasing pressure.  On exam, membranes still intact with baby's head in the vaginal vault.  NICU team called to delivery room.  Patient began pushing and after several pushes her membranes ruptured spontaneously with mildly foul smelling fluid.  On the next contraction, at 11:17 PM, a viable female was delivered via Vaginal, Spontaneous Delivery (Presentation: Occiput Anterior).  The cord was clamped and cut and the baby was delivered to the warmer to the awaiting NICU team, who began to resuscitate the baby.  APGAR: 4, 5, 7; weight 1 lb 9.8 oz (731 g).   Placenta status: Intact, Spontaneous.  Placenta sent to pathology.  Cord: 3 vessels with the following complications: None.  On examination of the placenta, a clot was seen, which may represent a partial abruption.  Anesthesia: None  Episiotomy: None Lacerations: None Est. Blood Loss (mL): 200  Mom to postpartum.  Baby to NICU.  Mom stable on my departure.  STINSON, JACOB JEHIEL 01/01/2014, 11:51 PM

## 2013-09-18 ENCOUNTER — Encounter (HOSPITAL_COMMUNITY): Payer: Self-pay | Admitting: Emergency Medicine

## 2013-09-18 ENCOUNTER — Emergency Department (HOSPITAL_COMMUNITY)
Admission: EM | Admit: 2013-09-18 | Discharge: 2013-09-19 | Discharge status: Against Medical Advice | Payer: Medicaid Other | Attending: Emergency Medicine | Admitting: Emergency Medicine

## 2013-09-18 DIAGNOSIS — R110 Nausea: Secondary | ICD-10-CM | POA: Insufficient documentation

## 2013-09-18 DIAGNOSIS — J45909 Unspecified asthma, uncomplicated: Secondary | ICD-10-CM | POA: Insufficient documentation

## 2013-09-18 DIAGNOSIS — O9989 Other specified diseases and conditions complicating pregnancy, childbirth and the puerperium: Secondary | ICD-10-CM | POA: Insufficient documentation

## 2013-09-18 DIAGNOSIS — M545 Low back pain, unspecified: Secondary | ICD-10-CM | POA: Insufficient documentation

## 2013-09-18 DIAGNOSIS — O99330 Smoking (tobacco) complicating pregnancy, unspecified trimester: Secondary | ICD-10-CM | POA: Insufficient documentation

## 2013-09-18 DIAGNOSIS — R300 Dysuria: Secondary | ICD-10-CM | POA: Insufficient documentation

## 2013-09-18 DIAGNOSIS — M899 Disorder of bone, unspecified: Secondary | ICD-10-CM | POA: Insufficient documentation

## 2013-09-18 DIAGNOSIS — R109 Unspecified abdominal pain: Secondary | ICD-10-CM | POA: Insufficient documentation

## 2013-09-18 DIAGNOSIS — M259 Joint disorder, unspecified: Secondary | ICD-10-CM | POA: Insufficient documentation

## 2013-09-18 LAB — POC URINE PREG, ED: Preg Test, Ur: POSITIVE — AB

## 2013-09-18 NOTE — ED Notes (Signed)
Patient here with complaint of abdominal pain starting about 3 days ago. Patient has not had a period for 2 months and had a positive home pregnancy test. Also complaining of dysuria, nausea, and low back pain.

## 2013-09-19 ENCOUNTER — Encounter (HOSPITAL_COMMUNITY): Payer: Self-pay

## 2013-09-19 ENCOUNTER — Inpatient Hospital Stay (HOSPITAL_COMMUNITY)
Admission: AD | Admit: 2013-09-19 | Discharge: 2013-09-19 | Disposition: A | Discharge status: Home / Self Care | Payer: Medicaid Other | Source: Ambulatory Visit | Attending: Obstetrics & Gynecology | Admitting: Obstetrics & Gynecology

## 2013-09-19 DIAGNOSIS — Z3201 Encounter for pregnancy test, result positive: Secondary | ICD-10-CM | POA: Insufficient documentation

## 2013-09-19 NOTE — Discharge Instructions (Signed)
Prenatal Care  °WHAT IS PRENATAL CARE?  °Prenatal care means health care during your pregnancy, before your baby is born. It is very important to take care of yourself and your baby during your pregnancy by:  °· Getting early prenatal care. If you know you are pregnant, or think you might be pregnant, call your health care provider as soon as possible. Schedule a visit for a prenatal exam. °· Getting regular prenatal care. Follow your health care provider's schedule for blood and other necessary tests. Do not miss appointments. °· Doing everything you can to keep yourself and your baby healthy during your pregnancy. °· Getting complete care. Prenatal care should include evaluation of the medical, dietary, educational, psychological, and social needs of you and your significant other. The medical and genetic history of your family and the family of your baby's father should be discussed with your health care provider. °· Discussing with your health care provider: °· Prescription, over-the-counter, and herbal medicines that you take. °· Any history of substance abuse, alcohol use, smoking, and illegal drug use. °· Any history of domestic abuse and violence. °· Immunizations you have received. °· Your nutrition and diet. °· The amount of exercise you do. °· Any environmental and occupational hazards to which you are exposed. °· History of sexually transmitted infections for both you and your partner. °· Previous pregnancies you have had. °WHY IS PRENATAL CARE SO IMPORTANT?  °By regularly seeing your health care provider, you help ensure that problems can be identified early so that they can be treated as soon as possible. Other problems might be prevented. Many studies have shown that early and regular prenatal care is important for the health of mothers and their babies.  °HOW CAN I TAKE CARE OF MYSELF WHILE I AM PREGNANT?  °Here are ways to take care of yourself and your baby:  °· Start or continue taking your  multivitamin with 400 micrograms (mcg) of folic acid every day. °· Get early and regular prenatal care. It is very important to see a health care provider during your pregnancy. Your health care provider will check at each visit to make sure that you and the baby are healthy. If there are any problems, action can be taken right away to help you and the baby. °· Eat a healthy diet that includes: °· Fruits. °· Vegetables. °· Foods low in saturated fat. °· Whole grains. °· Calcium-rich foods, such as milk, yogurt, and hard cheeses. °· Drink 6 to 8 glasses of liquids a day. °· Unless your health care provider tells you not to, try to be physically active for 30 minutes, most days of the week. If you are pressed for time, you can get your activity in through 10-minute segments, three times a day. °· Do not smoke, drink alcohol, or use drugs. These can cause long-term damage to your baby. Talk with your health care provider about steps to take to stop smoking. Talk with a member of your faith community, a counselor, a trusted friend, or your health care provider if you are concerned about your alcohol or drug use. °· Ask your health care provider before taking any medicine, even over-the-counter medicines. Some medicines are not safe to take during pregnancy. °· Get plenty of rest and sleep. °· Avoid hot tubs and saunas during pregnancy. °· Do not have X-rays taken unless absolutely necessary and with the recommendation of your health care provider. A lead shield can be placed on your abdomen to protect the   baby when X-rays are taken in other parts of the body. °· Do not empty the cat litter when you are pregnant. It may contain a parasite that causes an infection called toxoplasmosis, which can cause birth defects. Also, use gloves when working in garden areas used by cats. °· Do not eat uncooked or undercooked meats or fish. °· Do not eat soft, mold-ripened cheeses (Brie, Camembert, and chevre) or soft, blue-veined  cheese (Danish blue and Roquefort). °· Stay away from toxic chemicals like: °· Insecticides. °· Solvents (some cleaners or paint thinners). °· Lead. °· Mercury. °· Sexual intercourse may continue until the end of the pregnancy, unless you have a medical problem or there is a problem with the pregnancy and your health care provider tells you not to. °· Do not wear high-heel shoes, especially during the second half of the pregnancy. You can lose your balance and fall. °· Do not take long trips, unless absolutely necessary. Be sure to see your health care provider before going on the trip. °· Do not sit in one position for more than 2 hours when on a trip. °· Take a copy of your medical records when going on a trip. Know where a hospital is located in the city you are visiting, in case of an emergency. °· Most dangerous household products will have pregnancy warnings on their labels. Ask your health care provider about products if you are unsure. °· Limit or eliminate your caffeine intake from coffee, tea, sodas, medicines, and chocolate. °· Many women continue working through pregnancy. Staying active might help you stay healthier. If you have a question about the safety or the hours you work at your particular job, talk with your health care provider. °· Get informed: °· Read books. °· Watch videos. °· Go to childbirth classes for you and your significant other. °· Talk with experienced moms. °· Ask your health care provider about childbirth education classes for you and your partner. Classes can help you and your partner prepare for the birth of your baby. °· Ask about a baby doctor (pediatrician) and methods and pain medicine for labor, delivery, and possible cesarean delivery. °HOW OFTEN SHOULD I SEE MY HEALTH CARE PROVIDER DURING PREGNANCY?  °Your health care provider will give you a schedule for your prenatal visits. You will have visits more often as you get closer to the end of your pregnancy. An average  pregnancy lasts about 40 weeks.  °A typical schedule includes visiting your health care provider:  °· About once each month during your first 6 months of pregnancy. °· Every 2 weeks during the next 2 months. °· Weekly in the last month, until the delivery date. °Your health care provider will probably want to see you more often if: °· You are older than 35 years. °· Your pregnancy is high risk because you have certain health problems or problems with the pregnancy, such as: °· Diabetes. °· High blood pressure. °· The baby is not growing on schedule, according to the dates of the pregnancy. °Your health care provider will do special tests to make sure you and the baby are not having any serious problems. °WHAT HAPPENS DURING PRENATAL VISITS?  °· At your first prenatal visit, your health care provider will do a physical exam and talk to you about your health history and the health history of your partner and your family. Your health care provider will be able to tell you what date to expect your baby to be born on. °·   Your first physical exam will include checks of your blood pressure, measurements of your height and weight, and an exam of your pelvic organs. Your health care provider will do a Pap test if you have not had one recently and will do cultures of your cervix to make sure there is no infection.  At each prenatal visit, there will be tests of your blood, urine, blood pressure, weight, and checking the progress of the baby.  At your later prenatal visits, your health care provider will check how you are doing and how the baby is developing. You may have a number of tests done as your pregnancy progresses.  Ultrasound exams are often used to check on the baby's growth and health.  You may have more urine and blood tests, as well as special tests, if needed. These may include amniocentesis to examine fluid in the pregnancy sac, stress tests to check how the baby responds to contractions, or a  biophysical profile to measure fetus well-being. Your health care provider will explain the tests and why they are necessary.  You should discuss with your health care provider your plans to breastfeed or bottle-feed your baby.  Each visit is also a chance for you to learn about staying healthy during pregnancy and to ask questions. Document Released: 03/26/2003 Document Revised: 01/11/2013 Document Reviewed: 09/08/2012 [facility] Patient Information 2014 [place], [place]. Pregnancy, First Trimester The first trimester is the first 3 months your baby is growing inside you. It is important to follow your doctor's instructions. HOME CARE   Do not smoke.  Do not drink alcohol.  Only take medicine as told by your doctor.  Exercise.  Eat healthy foods. Eat regular, well-balanced meals.  You can have sex (intercourse) if there are no other problems with the pregnancy.  Things that help with morning sickness:  Eat soda crackers before getting up in the morning.  Eat 4 to 5 small meals rather than 3 large meals.  Drink liquids between meals, not during meals.  Go to all appointments as told.  Take all vitamins or supplements as told by your doctor. GET HELP RIGHT AWAY IF:   You develop a fever.  You have a bad smelling fluid that is leaking from your vagina.  There is bleeding from the vagina.  You develop severe belly (abdominal) or back pain.  You throw up (vomit) blood. It may look like coffee grounds.  You lose more than 2 pounds in a week.  You gain 5 pounds or more in a week.  You gain more than 2 pounds in a week and you see puffiness (swelling) in your feet, ankles, or legs.  You have severe dizziness or pass out (faint).  You are around people who have [place] measles, chickenpox, or fifth disease.  You have a headache, watery poop (diarrhea), pain with peeing (urinating), or cannot breath right. Document Released: 09/09/2007 Document Revised: 06/15/2011 Document  Reviewed: 09/09/2007 [facility] Patient Information 2014 [place], [place].

## 2013-09-19 NOTE — MAU Note (Signed)
Patient states she has not had a period since April. [facility] but did not wait for the result of the pregnancy test. Just wants confirmation of pregnancy. Denies pain, bleeding, discharge, nausea or vomiting.

## 2013-09-19 NOTE — MAU Provider Note (Signed)
[name] is a 28 y.o. G3P1011 at 3056w6d who presents to MAU today for pregnancy confirmation. [facility] and did not wait for the results after being told it would be a 4 hours wait. UPT yesterday was positive. LMP 07/05/13 was normal. Patient denies abdominal pain or vaginal bleeding today.   BP 116/80  Pulse 81  Temp(Src) 99.1 F (37.3 C) (Oral)  Resp 16  Ht 5' 8.5" (1.74 m)  Wt 132 lb 6.4 oz (60.056 kg)  BMI 19.84 kg/m2  SpO2 100%  LMP 07/05/2013 GENERAL: Well-developed, well-nourished female in no acute distress.  HEENT: Normocephalic, atraumatic.   LUNGS: Effort normal HEART: Regular rate  SKIN: Warm, dry and without erythema PSYCH: Normal mood and affect  Results for orders placed during the hospital encounter of 09/18/13 (from the past 24 hour(s))  POC URINE PREG, ED     Status: Abnormal   Collection Time    09/18/13  9:24 PM      Result Value Ref Range   Preg Test, Ur POSITIVE (*) NEGATIVE    A: Positive pregnancy test  P: Discharge home First trimester warning signs discussed Patient advised to start prenatal care ASAP Pregnancy confirmation letter and list of area providers given. [facility] Patient may return to MAU as needed or if her condition were to change or worsen  Freddi StarrJulie N Ethier, PA-C 09/19/2013 6:50 PM

## 2013-09-22 NOTE — MAU Provider Note (Signed)

## 2013-10-26 ENCOUNTER — Other Ambulatory Visit: Payer: Self-pay | Admitting: Obstetrics & Gynecology

## 2013-10-26 DIAGNOSIS — O3680X0 Pregnancy with inconclusive fetal viability, not applicable or unspecified: Secondary | ICD-10-CM

## 2013-10-30 ENCOUNTER — Ambulatory Visit (INDEPENDENT_AMBULATORY_CARE_PROVIDER_SITE_OTHER): Payer: Medicaid Other

## 2013-10-30 ENCOUNTER — Ambulatory Visit: Payer: Self-pay

## 2013-10-30 ENCOUNTER — Other Ambulatory Visit: Payer: Self-pay | Admitting: Obstetrics & Gynecology

## 2013-10-30 DIAGNOSIS — O3680X0 Pregnancy with inconclusive fetal viability, not applicable or unspecified: Secondary | ICD-10-CM

## 2013-10-30 DIAGNOSIS — O09299 Supervision of pregnancy with other poor reproductive or obstetric history, unspecified trimester: Secondary | ICD-10-CM

## 2013-10-30 NOTE — Progress Notes (Signed)
U/S(16+5wks)-acitve fetus, meas c/w dates, fluid wnl, anterior Gr placenta, cx appears closed, bilateral adnexa appears WNL FHR- 152 bpm, female fetus, need complete fetal anatomy survey to be scheduled

## 2013-10-31 ENCOUNTER — Encounter: Payer: Medicaid Other | Admitting: Women's Health

## 2013-11-05 ENCOUNTER — Encounter (HOSPITAL_COMMUNITY): Payer: Self-pay | Admitting: Emergency Medicine

## 2013-11-05 ENCOUNTER — Emergency Department (HOSPITAL_COMMUNITY)
Admission: EM | Admit: 2013-11-05 | Discharge: 2013-11-05 | Disposition: A | Discharge status: Home / Self Care | Payer: Medicaid Other | Attending: Emergency Medicine | Admitting: Emergency Medicine

## 2013-11-05 DIAGNOSIS — K089 Disorder of teeth and supporting structures, unspecified: Secondary | ICD-10-CM | POA: Diagnosis present

## 2013-11-05 DIAGNOSIS — K029 Dental caries, unspecified: Secondary | ICD-10-CM | POA: Diagnosis not present

## 2013-11-05 DIAGNOSIS — Z880 Allergy status to penicillin: Secondary | ICD-10-CM | POA: Diagnosis not present

## 2013-11-05 DIAGNOSIS — Z792 Long term (current) use of antibiotics: Secondary | ICD-10-CM | POA: Diagnosis not present

## 2013-11-05 DIAGNOSIS — F17200 Nicotine dependence, unspecified, uncomplicated: Secondary | ICD-10-CM | POA: Insufficient documentation

## 2013-11-05 DIAGNOSIS — J45909 Unspecified asthma, uncomplicated: Secondary | ICD-10-CM | POA: Diagnosis not present

## 2013-11-05 MED ORDER — CLINDAMYCIN HCL 300 MG PO CAPS: 300.0000 mg | ORAL_CAPSULE | Freq: Four times a day (QID) | ORAL | Status: DC

## 2013-11-05 MED ORDER — HYDROCODONE-ACETAMINOPHEN 5-325 MG PO TABS: 1.0000 | ORAL_TABLET | Freq: Four times a day (QID) | ORAL | Status: DC | PRN

## 2013-11-05 NOTE — ED Notes (Signed)
Pt c/o dental pain with swelling that started two days ago, pt states that she is having a hard time breathing due to the swelling. Pt anxious tearful on arrival to triage,

## 2013-11-05 NOTE — ED Notes (Signed)
Fetal heart rate of 157 

## 2013-11-05 NOTE — ED Provider Notes (Signed)
CSN: [id number]     Arrival date & time 11/05/13  1557 History  This chart was scribed for Sandra Lennert, MD by Leona Carry, ED Scribe. The patient was seen in [facility]. The patient's care was started at 4:23 PM.     Chief Complaint  Patient presents with  . Dental Pain   HPI HPI Comments: [name] is a 28 y.o. female with a history of asthma who presents to the Emergency Department complaining of dental pain in her right  beginning a few hours ago while at home. She states that she broke this tooth approximately two weeks ago, but denies any trauma today. Patient characterizes the pain as a 10/10 in severity worse to chew. Patient reports associated mild facial swelling. No fever, drool, trismus, neck swelling, SOB, CP. Patient is currently pregnant. She denies any complications with the pregnancy.  PCP is Dr. Phillips Odor.   Past Medical History  Diagnosis Date  . Asthma   . Pregnant    Past Surgical History  Procedure Laterality Date  . Breat implants    . Breast enhancement surgery     No family history on file. History  Substance Use Topics  . Smoking status: Current Some Day Smoker -- 0.10 packs/day    Types: Cigarettes  . Smokeless tobacco: Not on file  . Alcohol Use: No   OB History   Grav Para Term Preterm Abortions TAB SAB Ect Mult Living   3 1 1  0 1 1 0 0 0 1     Review of Systems  10 Systems reviewed and are negative for acute change except as noted in the HPI.  Allergies  Penicillins  Home Medications   Prior to Admission medications   Medication Sig Start Date End Date Taking? Authorizing Provider  clindamycin (CLEOCIN) 300 MG capsule Take 1 capsule (300 mg total) by mouth 4 (four) times daily. X 7 days 11/05/13   Hurman Horn, MD  HYDROcodone-acetaminophen (NORCO) 5-325 MG per tablet Take 1-2 tablets by mouth every 6 (six) hours as needed for severe pain. 11/05/13   Hurman Horn, MD   Triage Vitals: BP 116/75  Pulse 83  Temp(Src) 98.4  F (36.9 C)  Resp 20  SpO2 100%  LMP 07/05/2013 Physical Exam  Nursing note and vitals reviewed. Constitutional:  Awake, alert, nontoxic appearance.  HENT:  Head: Atraumatic.  Oropharynx non injected. Tongue normal. Mucous membranes moist.   No trisms, no drooling, no stridor.  Tooth 32 has dental fracture with cavity present with tenderness to percussion. Tenderness localized to tooth 32 Localized gingival tenderness without gingival swelling or drainage.   Eyes: Right eye exhibits no discharge. Left eye exhibits no discharge.  Neck: Neck supple.  Cardiovascular: Regular rhythm and normal heart sounds.   No murmur heard. Pulmonary/Chest: Effort normal and breath sounds normal. No respiratory distress. She has no wheezes. She has no rales. She exhibits no tenderness.  Abdominal: Soft. There is no tenderness. There is no rebound.  Musculoskeletal: She exhibits no edema and no tenderness.  Baseline ROM, no obvious new focal weakness.  Neurological:  Mental status and motor strength appears baseline for patient and situation.  Skin: No rash noted.  Psychiatric: She has a normal mood and affect.    ED Course  Procedures (including critical care time) DIAGNOSTIC STUDIES: Oxygen Saturation is 100% on room air, normal by my interpretation.    COORDINATION OF CARE: 4:29 PM-Discussed treatment plan which includes Cleocin, Norco, and instructions  to follow up with dentist with pt at bedside and pt agreed to plan.     Labs Review Labs Reviewed - No data to display  Imaging Review No results found.   EKG Interpretation None      MDM   Final diagnoses:  Dental cavity    I doubt any other EMC precluding discharge at this time including, but not necessarily limited to the following:deep space neck infection.  This chart was scribed in my presence and reviewed by me personally.   Hurman HornJohn M Climmie Cronce, MD 11/06/13 [phone]

## 2013-11-05 NOTE — Discharge Instructions (Signed)

## 2013-11-06 ENCOUNTER — Encounter: Payer: Self-pay | Admitting: Women's Health

## 2013-11-06 ENCOUNTER — Ambulatory Visit (INDEPENDENT_AMBULATORY_CARE_PROVIDER_SITE_OTHER): Payer: Medicaid Other | Admitting: Women's Health

## 2013-11-06 VITALS — BP 120/82 | Wt 134.0 lb

## 2013-11-06 DIAGNOSIS — Z1371 Encounter for nonprocreative screening for genetic disease carrier status: Secondary | ICD-10-CM

## 2013-11-06 DIAGNOSIS — Z0189 Encounter for other specified special examinations: Secondary | ICD-10-CM

## 2013-11-06 DIAGNOSIS — Z1159 Encounter for screening for other viral diseases: Secondary | ICD-10-CM

## 2013-11-06 DIAGNOSIS — Z3482 Encounter for supervision of other normal pregnancy, second trimester: Secondary | ICD-10-CM

## 2013-11-06 DIAGNOSIS — O0990 Supervision of high risk pregnancy, unspecified, unspecified trimester: Secondary | ICD-10-CM | POA: Insufficient documentation

## 2013-11-06 DIAGNOSIS — Z1389 Encounter for screening for other disorder: Secondary | ICD-10-CM

## 2013-11-06 DIAGNOSIS — O0930 Supervision of pregnancy with insufficient antenatal care, unspecified trimester: Secondary | ICD-10-CM | POA: Insufficient documentation

## 2013-11-06 DIAGNOSIS — F418 Other specified anxiety disorders: Secondary | ICD-10-CM | POA: Insufficient documentation

## 2013-11-06 DIAGNOSIS — Z331 Pregnant state, incidental: Secondary | ICD-10-CM

## 2013-11-06 DIAGNOSIS — Z0184 Encounter for antibody response examination: Secondary | ICD-10-CM

## 2013-11-06 DIAGNOSIS — Z130 Encounter for screening for diseases of the blood and blood-forming organs and certain disorders involving the immune mechanism: Secondary | ICD-10-CM

## 2013-11-06 DIAGNOSIS — Z36 Encounter for antenatal screening of mother: Secondary | ICD-10-CM

## 2013-11-06 DIAGNOSIS — Z3480 Encounter for supervision of other normal pregnancy, unspecified trimester: Secondary | ICD-10-CM

## 2013-11-06 LAB — POCT URINALYSIS DIPSTICK
GLUCOSE UA: NEGATIVE
Ketones, UA: NEGATIVE
Nitrite, UA: NEGATIVE

## 2013-11-06 LAB — CBC
HCT: 32.7 % — ABNORMAL LOW (ref 36.0–46.0)
HEMOGLOBIN: 11.6 g/dL — AB (ref 12.0–15.0)
MCH: 31.9 pg (ref 26.0–34.0)
MCHC: 35.5 g/dL (ref 30.0–36.0)
MCV: 89.8 fL (ref 78.0–100.0)
PLATELETS: 319 10*3/uL (ref 150–400)
RBC: 3.64 MIL/uL — AB (ref 3.87–5.11)
RDW: 13.9 % (ref 11.5–15.5)
WBC: 9 10*3/uL (ref 4.0–10.5)

## 2013-11-06 MED ORDER — ESCITALOPRAM OXALATE 10 MG PO TABS: 10.0000 mg | ORAL_TABLET | Freq: Every day | ORAL | Status: DC

## 2013-11-06 NOTE — Progress Notes (Signed)
  Subjective:  Sandra ComingsJessica V Lavey is a 28 y.o. 313P1011 African American female at 6653w5d by LMP c/w 16wk u/s, being seen today for her first obstetrical visit.  Her obstetrical history is significant for smokes 1/2 cigarette/week, term uncomplicated svd x 1, does not have custody of that child- lives w/ aunt.  Pregnancy history fully reviewed.  Patient reports toothache- went to ED yesterday- got rx for cleocin & vicodin, has appt w/ dentist soon. Also h/o depression/anxiety- used to be on lexapro- has been off for awhile, feels like she needs to restart. Doesn't have local mental health provider. Denies SI/HI. Denies vb, cramping, uti s/s, abnormal/malodorous vag d/c, or vulvovaginal itching/irritation.  BP 120/82  Wt 134 lb (60.782 kg)  LMP 07/05/2013  HISTORY: OB History  Gravida Para Term Preterm AB SAB TAB Ectopic Multiple Living  3 1 1  0 1 0 1 0 0 1    # Outcome Date GA Lbr Len/2nd Weight Sex Delivery Anes PTL Lv  3 CUR           2 TRM 09/02/04 3341w0d  6 lb 9 oz (2.977 kg) F SVD EPI  Y  1 TAB              Past Medical History  Diagnosis Date  . Asthma   . Pregnant    Past Surgical History  Procedure Laterality Date  . Breat implants    . Breast enhancement surgery     History reviewed. No pertinent family history.  Exam   Room smells of THC  System:     General: Well developed & nourished, no acute distress   Skin: Warm & dry, normal coloration and turgor, no rashes   Neurologic: Alert & oriented, normal mood   Cardiovascular: Regular rate & rhythm   Respiratory: Effort & rate normal, LCTAB, acyanotic   Abdomen: Soft, non tender   Extremities: normal strength, tone   Thin prep pap smear 2014 @ [facility], neg per pt  FHR: 160 via doppler   Assessment:   Pregnancy: G3P1011 Patient Active Problem List   Diagnosis Date Noted  . Supervision of other normal pregnancy 11/06/2013    Priority: High  . Late prenatal care affecting pregnancy 11/06/2013    Priority: High     5453w5d G3P1011 New OB visit Late prenatal care Dental pain Smoker Doesn't have custody of other child Depression/anxiet    Plan:  Initial labs drawn Continue prenatal vitamins Rx lexapro 10mg  daily, knows not to expect results x 3-4wks, [facility] referral to [facility] Problem list reviewed and updated Reviewed n/v relief measures and warning s/s to report Reviewed recommended weight gain based on pre-gravid BMI Encouraged well-balanced diet Genetic Screening discussed Quad Screen: ordered Cystic fibrosis screening discussed requested Ultrasound discussed; fetal survey: requested Follow up in 2 weeks for anatomy u/s and visit [facility] completed  Marge DuncansBooker, Kimberly Randall CNM, [facility]WHNP-BC 11/06/2013 4:22 PM

## 2013-11-06 NOTE — Patient Instructions (Signed)
Second Trimester of Pregnancy The second trimester is from week 13 through week 28, months 4 through 6. The second trimester is often a time when you feel your best. Your body has also adjusted to being pregnant, and you begin to feel better physically. Usually, morning sickness has lessened or quit completely, you may have more energy, and you may have an increase in appetite. The second trimester is also a time when the fetus is growing rapidly. At the end of the sixth month, the fetus is about 9 inches long and weighs about 1 pounds. You will likely begin to feel the baby move (quickening) between 18 and 20 weeks of the pregnancy. BODY CHANGES Your body goes through many changes during pregnancy. The changes vary from woman to woman.   Your weight will continue to increase. You will notice your lower abdomen bulging out.  You may begin to get stretch marks on your hips, abdomen, and breasts.  You may develop headaches that can be relieved by medicines approved by your health care provider.  You may urinate more often because the fetus is pressing on your bladder.  You may develop or continue to have heartburn as a result of your pregnancy.  You may develop constipation because certain hormones are causing the muscles that push waste through your intestines to slow down.  You may develop hemorrhoids or swollen, bulging veins (varicose veins).  You may have back pain because of the weight gain and pregnancy hormones relaxing your joints between the bones in your pelvis and as a result of a shift in weight and the muscles that support your balance.  Your breasts will continue to grow and be tender.  Your gums may bleed and may be sensitive to brushing and flossing.  Dark spots or blotches (chloasma, mask of pregnancy) may develop on your face. This will likely fade after the baby is born.  A dark line from your belly button to the pubic area (linea nigra) may appear. This will likely fade  after the baby is born.  You may have changes in your hair. These can include thickening of your hair, rapid growth, and changes in texture. Some women also have hair loss during or after pregnancy, or hair that feels dry or thin. Your hair will most likely return to normal after your baby is born. WHAT TO EXPECT AT YOUR PRENATAL VISITS During a routine prenatal visit:  You will be weighed to make sure you and the fetus are growing normally.  Your blood pressure will be taken.  Your abdomen will be measured to track your baby's growth.  The fetal heartbeat will be listened to.  Any test results from the previous visit will be discussed. Your health care provider may ask you:  How you are feeling.  If you are feeling the baby move.  If you have had any abnormal symptoms, such as leaking fluid, bleeding, severe headaches, or abdominal cramping.  If you have any questions. Other tests that may be performed during your second trimester include:  Blood tests that check for:  Low iron levels (anemia).  Gestational diabetes (between 24 and 28 weeks).  Rh antibodies.  Urine tests to check for infections, diabetes, or protein in the urine.  An ultrasound to confirm the proper growth and development of the baby.  An amniocentesis to check for possible genetic problems.  Fetal screens for spina bifida and Down syndrome. HOME CARE INSTRUCTIONS   Avoid all smoking, herbs, alcohol, and unprescribed   drugs. These chemicals affect the formation and growth of the baby.  Follow your health care provider's instructions regarding medicine use. There are medicines that are either safe or unsafe to take during pregnancy.  Exercise only as directed by your health care provider. Experiencing uterine cramps is a good sign to stop exercising.  Continue to eat regular, healthy meals.  Wear a good support bra for breast tenderness.  Do not use hot tubs, steam rooms, or saunas.  Wear your  seat belt at all times when driving.  Avoid raw meat, uncooked cheese, cat litter boxes, and soil used by cats. These carry germs that can cause birth defects in the baby.  Take your prenatal vitamins.  Try taking a stool softener (if your health care provider approves) if you develop constipation. Eat more high-fiber foods, such as fresh vegetables or fruit and whole grains. Drink plenty of fluids to keep your urine clear or pale yellow.  Take warm sitz baths to soothe any pain or discomfort caused by hemorrhoids. Use hemorrhoid cream if your health care provider approves.  If you develop varicose veins, wear support hose. Elevate your feet for 15 minutes, 3-4 times a day. Limit salt in your diet.  Avoid heavy lifting, wear low heel shoes, and practice good posture.  Rest with your legs elevated if you have leg cramps or low back pain.  Visit your dentist if you have not gone yet during your pregnancy. Use a soft toothbrush to brush your teeth and be gentle when you floss.  A sexual relationship may be continued unless your health care provider directs you otherwise.  Continue to go to all your prenatal visits as directed by your health care provider. SEEK MEDICAL CARE IF:   You have dizziness.  You have mild pelvic cramps, pelvic pressure, or nagging pain in the abdominal area.  You have persistent nausea, vomiting, or diarrhea.  You have a bad smelling vaginal discharge.  You have pain with urination. SEEK IMMEDIATE MEDICAL CARE IF:   You have a fever.  You are leaking fluid from your vagina.  You have spotting or bleeding from your vagina.  You have severe abdominal cramping or pain.  You have rapid weight gain or loss.  You have shortness of breath with chest pain.  You notice sudden or extreme swelling of your face, hands, ankles, feet, or legs.  You have not felt your baby move in over an hour.  You have severe headaches that do not go away with  medicine.  You have vision changes. Document Released: 03/17/2001 Document Revised: 03/28/2013 Document Reviewed: 05/24/2012 Exit[employer] Patient Information 2015 [employer]. This information is not intended to replace advice given to you by your health care provider. Make sure you discuss any questions you have with your health care provider.  

## 2013-11-07 ENCOUNTER — Encounter: Payer: Self-pay | Admitting: Women's Health

## 2013-11-07 ENCOUNTER — Telehealth: Payer: Self-pay | Admitting: Women's Health

## 2013-11-07 DIAGNOSIS — F1990 Other psychoactive substance use, unspecified, uncomplicated: Secondary | ICD-10-CM | POA: Insufficient documentation

## 2013-11-07 DIAGNOSIS — O98212 Gonorrhea complicating pregnancy, second trimester: Secondary | ICD-10-CM

## 2013-11-07 HISTORY — DX: Gonorrhea complicating pregnancy, second trimester: O98.212

## 2013-11-07 LAB — AFP, QUAD SCREEN
AFP: 61.6 IU/mL
Curr Gest Age: 16.6 wks.days
Down Syndrome Scr Risk Est: <1:38500 {titer}
HCG TOTAL: 14270 m[IU]/mL
INH: 91.5 pg/mL
Interpretation-AFP: NEGATIVE
MOM FOR HCG: 0.57
MoM for AFP: 1.54
MoM for INH: 0.49
OPEN SPINA BIFIDA: NEGATIVE
TRI 18 SCR RISK EST: NEGATIVE
uE3 Mom: 1.34
uE3 Value: 0.8 ng/mL

## 2013-11-07 LAB — URINALYSIS, ROUTINE W REFLEX MICROSCOPIC
Bilirubin Urine: NEGATIVE
Glucose, UA: NEGATIVE mg/dL
KETONES UR: NEGATIVE mg/dL
NITRITE: NEGATIVE
PH: 6.5 (ref 5.0–8.0)
Protein, ur: 30 mg/dL — AB
Specific Gravity, Urine: 1.013 (ref 1.005–1.030)
Urobilinogen, UA: 1 mg/dL (ref 0.0–1.0)

## 2013-11-07 LAB — URINALYSIS, MICROSCOPIC ONLY
CASTS: NONE SEEN
CRYSTALS: NONE SEEN

## 2013-11-07 LAB — DRUG SCREEN, URINE, NO CONFIRMATION
AMPHETAMINE SCRN UR: NEGATIVE
BARBITURATE QUANT UR: NEGATIVE
Benzodiazepines.: NEGATIVE
COCAINE METABOLITES: POSITIVE — AB
Creatinine,U: 148 mg/dL
Marijuana Metabolite: POSITIVE — AB
Methadone: NEGATIVE
Opiate Screen, Urine: POSITIVE — AB
Phencyclidine (PCP): NEGATIVE
Propoxyphene: NEGATIVE

## 2013-11-07 LAB — SICKLE CELL SCREEN: Sickle Cell Screen: NEGATIVE

## 2013-11-07 LAB — OXYCODONE SCREEN, UA, RFLX CONFIRM: Oxycodone Screen, Ur: NEGATIVE ng/mL

## 2013-11-07 LAB — VARICELLA ZOSTER ANTIBODY, IGG: VARICELLA IGG: 1073 {index} — AB (ref <135.00)

## 2013-11-07 LAB — HEPATITIS B SURFACE ANTIGEN: Hepatitis B Surface Ag: NEGATIVE

## 2013-11-07 LAB — HIV ANTIBODY (ROUTINE TESTING W REFLEX): HIV 1&2 Ab, 4th Generation: NONREACTIVE

## 2013-11-07 LAB — ABO AND RH: Rh Type: POSITIVE

## 2013-11-07 LAB — GC/CHLAMYDIA PROBE AMP
CT Probe RNA: NEGATIVE
GC Probe RNA: POSITIVE — AB

## 2013-11-07 LAB — RUBELLA SCREEN: RUBELLA: 3.39 {index} — AB (ref <0.90)

## 2013-11-07 LAB — RPR

## 2013-11-07 LAB — ANTIBODY SCREEN: Antibody Screen: NEGATIVE

## 2013-11-07 MED ORDER — AZITHROMYCIN 500 MG PO TABS: 1000.0000 mg | ORAL_TABLET | Freq: Once | ORAL | Status: DC

## 2013-11-07 NOTE — Telephone Encounter (Signed)
Pt notified of +GC, needs to come in for rocephin 250mg  IM x 1, rx for azithromycin 1gm sent to her pharmacy- notified her that her partner also needs to be treated- [facility], no sex x at least 1wk after both treated. Also notified of + cocaine & THC on UDS- strongly advised cessation of both, discussed potential risks associated w/ us. Reviewed rest of pn1 as well.  Cheral MarkerKimberly R. Booker, CNM, WHNP-BC 11/07/2013 1:18 PM

## 2013-11-08 ENCOUNTER — Encounter: Payer: Medicaid Other | Admitting: Adult Health

## 2013-11-08 ENCOUNTER — Telehealth: Payer: Self-pay | Admitting: Women's Health

## 2013-11-08 ENCOUNTER — Encounter: Payer: Self-pay | Admitting: Women's Health

## 2013-11-08 ENCOUNTER — Other Ambulatory Visit: Payer: Self-pay | Admitting: Women's Health

## 2013-11-08 LAB — URINE CULTURE: Colony Count: 50000

## 2013-11-08 MED ORDER — CEFIXIME 400 MG PO CAPS: 400.0000 mg | ORAL_CAPSULE | Freq: Every day | ORAL | Status: DC

## 2013-11-08 NOTE — Telephone Encounter (Signed)
Pt was here earlier for rocephin im for +GC, allergic to pcn- reaction is hives, so decided not to do rocephin. E-scribed Suprax 400mg  po x 1, which gave same cross-sensitivity alert, so called pt and told her to take 25mg  benadryl 8130mins-1hour prior to taking suprax. Pt verbalized understanding. Also confirmed [facility]/ [facility] that this was OK w/ her allergy. States low chance for reaction, and pt has had cephalosporins in past w/o reactions.  Cheral MarkerKimberly R. Clotile Whittington, CNM, [facility]NP-BC 11/08/2013 4:57 PM

## 2013-11-10 LAB — CYSTIC FIBROSIS DIAGNOSTIC STUDY

## 2013-11-13 ENCOUNTER — Encounter: Payer: Self-pay | Admitting: Women's Health

## 2013-11-21 ENCOUNTER — Other Ambulatory Visit: Payer: Medicaid Other

## 2013-11-21 ENCOUNTER — Encounter: Payer: Medicaid Other | Admitting: Obstetrics & Gynecology

## 2013-11-28 ENCOUNTER — Encounter: Payer: Medicaid Other | Admitting: Advanced Practice Midwife

## 2013-11-28 ENCOUNTER — Other Ambulatory Visit: Payer: Medicaid Other

## 2013-11-29 ENCOUNTER — Encounter: Payer: Self-pay | Admitting: Advanced Practice Midwife

## 2013-11-29 ENCOUNTER — Other Ambulatory Visit: Payer: Self-pay | Admitting: Obstetrics and Gynecology

## 2013-11-29 ENCOUNTER — Ambulatory Visit (INDEPENDENT_AMBULATORY_CARE_PROVIDER_SITE_OTHER): Payer: Medicaid Other

## 2013-11-29 ENCOUNTER — Ambulatory Visit (INDEPENDENT_AMBULATORY_CARE_PROVIDER_SITE_OTHER): Payer: Medicaid Other | Admitting: Advanced Practice Midwife

## 2013-11-29 ENCOUNTER — Other Ambulatory Visit: Payer: Self-pay | Admitting: Women's Health

## 2013-11-29 VITALS — BP 110/68 | Wt 145.0 lb

## 2013-11-29 DIAGNOSIS — O355XX0 Maternal care for (suspected) damage to fetus by drugs, not applicable or unspecified: Secondary | ICD-10-CM

## 2013-11-29 DIAGNOSIS — F1990 Other psychoactive substance use, unspecified, uncomplicated: Secondary | ICD-10-CM

## 2013-11-29 DIAGNOSIS — O26879 Cervical shortening, unspecified trimester: Secondary | ICD-10-CM

## 2013-11-29 DIAGNOSIS — O09899 Supervision of other high risk pregnancies, unspecified trimester: Secondary | ICD-10-CM

## 2013-11-29 DIAGNOSIS — Z3482 Encounter for supervision of other normal pregnancy, second trimester: Secondary | ICD-10-CM

## 2013-11-29 DIAGNOSIS — O355XX1 Maternal care for (suspected) damage to fetus by drugs, fetus 1: Secondary | ICD-10-CM

## 2013-11-29 DIAGNOSIS — F1410 Cocaine abuse, uncomplicated: Secondary | ICD-10-CM

## 2013-11-29 DIAGNOSIS — O26872 Cervical shortening, second trimester: Secondary | ICD-10-CM | POA: Insufficient documentation

## 2013-11-29 DIAGNOSIS — Z331 Pregnant state, incidental: Secondary | ICD-10-CM

## 2013-11-29 DIAGNOSIS — O98212 Gonorrhea complicating pregnancy, second trimester: Secondary | ICD-10-CM

## 2013-11-29 DIAGNOSIS — O99320 Drug use complicating pregnancy, unspecified trimester: Principal | ICD-10-CM

## 2013-11-29 DIAGNOSIS — O09299 Supervision of pregnancy with other poor reproductive or obstetric history, unspecified trimester: Secondary | ICD-10-CM

## 2013-11-29 DIAGNOSIS — F1920 Other psychoactive substance dependence, uncomplicated: Secondary | ICD-10-CM

## 2013-11-29 DIAGNOSIS — O3090 Multiple gestation, unspecified, unspecified trimester: Secondary | ICD-10-CM

## 2013-11-29 DIAGNOSIS — O09892 Supervision of other high risk pregnancies, second trimester: Secondary | ICD-10-CM

## 2013-11-29 DIAGNOSIS — Z1389 Encounter for screening for other disorder: Secondary | ICD-10-CM

## 2013-11-29 LAB — POCT URINALYSIS DIPSTICK
Blood, UA: NEGATIVE
GLUCOSE UA: NEGATIVE
KETONES UA: NEGATIVE
LEUKOCYTES UA: NEGATIVE
NITRITE UA: NEGATIVE
Protein, UA: NEGATIVE

## 2013-11-29 LAB — OB RESULTS CONSOLE GC/CHLAMYDIA
Chlamydia: NEGATIVE
Gonorrhea: NEGATIVE

## 2013-11-29 MED ORDER — PROGESTERONE MICRONIZED 200 MG PO CAPS: 200.0000 mg | ORAL_CAPSULE | Freq: Every day | ORAL | Status: DC

## 2013-11-29 NOTE — Progress Notes (Signed)
U/S(21+0wks)-active fetus, meas c/w dates, fluid WNL, FHR- 148 bpm, anterior Gr 0 placenta, bilateral adnexa appears WNL, female fetus, unable to view cardiac anatomy well due to fetal position, would like to reck anatomy at ~28weeks, no obvious abnl noted, Cx-2.4cm with funneling noted, decreased to 2.0cm with fundal pressure applied (measured vaginally)

## 2013-11-29 NOTE — Progress Notes (Signed)
High Risk Pregnancy Diagnosis(es):   Cocaine and MJ abuse  G3P1011 [redacted]w[redacted]d Estimated Date of Delivery: 04/11/14  Last menstrual period 07/05/2013.  Urinalysis: Negative   HPI: Anatomy scan today:  Normal fetus, although not all anatomy was visualized.  However, cx is 2.4cms, down to 2.0cms with funneling.  States that partner was  treated for [facility].  Has  had intercourseonce since being treated. Was vague about last cocaine use.  Reiterated dangers (abruption, preterm/previable delivery) with continued use   BP weight and urine results all reviewed and noted. Patient reports good fetal movement, denies any bleeding and no rupture of membranes symptoms or regular contractions.  Fetal Heart rate:  149 Edema:  no  Patient is without complaints. All questions were answered.  Assessment:  [redacted]w[redacted]d,   Cocaine/MJ abuse  Medication(s) Plans:  Prometrium  PV qhs  Treatment Plan:  UDS, recheck GC (POC)  Follow up in 1weeks for OB appt, recheck cx length

## 2013-11-30 LAB — DRUG SCREEN, URINE, NO CONFIRMATION
Amphetamine Screen, Ur: NEGATIVE
BARBITURATE QUANT UR: NEGATIVE
Benzodiazepines.: NEGATIVE
CREATININE, U: 56.7 mg/dL
Cocaine Metabolites: POSITIVE — AB
METHADONE: NEGATIVE
Marijuana Metabolite: POSITIVE — AB
Opiate Screen, Urine: NEGATIVE
Phencyclidine (PCP): NEGATIVE
Propoxyphene: NEGATIVE

## 2013-11-30 LAB — GC/CHLAMYDIA PROBE AMP
CT Probe RNA: NEGATIVE
GC Probe RNA: NEGATIVE

## 2013-11-30 LAB — OXYCODONE SCREEN, UA, RFLX CONFIRM: Oxycodone Screen, Ur: NEGATIVE ng/mL

## 2013-12-01 ENCOUNTER — Other Ambulatory Visit: Payer: Self-pay | Admitting: Advanced Practice Midwife

## 2013-12-01 DIAGNOSIS — O26872 Cervical shortening, second trimester: Secondary | ICD-10-CM

## 2013-12-02 ENCOUNTER — Encounter: Payer: Self-pay | Admitting: Women's Health

## 2013-12-06 ENCOUNTER — Encounter: Payer: Medicaid Other | Admitting: Advanced Practice Midwife

## 2013-12-06 ENCOUNTER — Other Ambulatory Visit: Payer: Medicaid Other

## 2013-12-07 ENCOUNTER — Other Ambulatory Visit: Payer: Medicaid Other

## 2013-12-07 ENCOUNTER — Encounter: Payer: Medicaid Other | Admitting: Obstetrics and Gynecology

## 2013-12-14 ENCOUNTER — Other Ambulatory Visit: Payer: Self-pay | Admitting: Obstetrics & Gynecology

## 2013-12-14 DIAGNOSIS — O26872 Cervical shortening, second trimester: Secondary | ICD-10-CM

## 2013-12-18 ENCOUNTER — Telehealth: Payer: Self-pay | Admitting: Obstetrics and Gynecology

## 2013-12-18 NOTE — Telephone Encounter (Signed)
Unable to contact pt. 

## 2013-12-19 ENCOUNTER — Ambulatory Visit (INDEPENDENT_AMBULATORY_CARE_PROVIDER_SITE_OTHER): Payer: Medicaid Other

## 2013-12-19 ENCOUNTER — Ambulatory Visit (INDEPENDENT_AMBULATORY_CARE_PROVIDER_SITE_OTHER): Payer: Medicaid Other | Admitting: Advanced Practice Midwife

## 2013-12-19 VITALS — BP 110/50 | Wt 145.0 lb

## 2013-12-19 DIAGNOSIS — F1990 Other psychoactive substance use, unspecified, uncomplicated: Secondary | ICD-10-CM

## 2013-12-19 DIAGNOSIS — Z331 Pregnant state, incidental: Secondary | ICD-10-CM

## 2013-12-19 DIAGNOSIS — O26872 Cervical shortening, second trimester: Secondary | ICD-10-CM

## 2013-12-19 DIAGNOSIS — Z1389 Encounter for screening for other disorder: Secondary | ICD-10-CM

## 2013-12-19 DIAGNOSIS — O26879 Cervical shortening, unspecified trimester: Secondary | ICD-10-CM

## 2013-12-19 DIAGNOSIS — O09899 Supervision of other high risk pregnancies, unspecified trimester: Secondary | ICD-10-CM

## 2013-12-19 DIAGNOSIS — O0992 Supervision of high risk pregnancy, unspecified, second trimester: Secondary | ICD-10-CM

## 2013-12-19 LAB — POCT URINALYSIS DIPSTICK
Glucose, UA: NEGATIVE
Ketones, UA: NEGATIVE
NITRITE UA: NEGATIVE
Protein, UA: NEGATIVE
RBC UA: NEGATIVE

## 2013-12-19 MED ORDER — PRENATAL VITAMINS 0.8 MG PO TABS: 1.0000 | ORAL_TABLET | Freq: Every day | ORAL | Status: DC

## 2013-12-19 NOTE — Progress Notes (Signed)
U/S(23+6wks)-FHR-141 bpm, cx with funneling noted with fundal pressure applied, cervix measures 1.3cm (vaginally)

## 2013-12-19 NOTE — Patient Instructions (Addendum)
[facility]:  [address] [address], [place], [place] [address] [phone]

## 2013-12-19 NOTE — Progress Notes (Signed)
High Risk Pregnancy Diagnosis(es):   Short cervix and Cocaine use  G3P1011 [redacted]w[redacted]d Estimated Date of Delivery: 04/11/14  Blood pressure 110/50, weight 145 lb (65.772 kg), last menstrual period 07/05/2013.  Urinalysis: Negative   HPI: Admits to "occ" using cocaine.  Risks (abruption, PTL, death) again discussed.  Pt "dances" 4 nights a week for work.  Encouraged to decrease physical activity as much as possible     BP weight and urine results all reviewed and noted. Patient reports good fetal movement, denies any bleeding and no rupture of membranes symptoms or regular contractions.  Cx length down to 1.3cms with funneling  Fundal Height:  19 Fetal Heart rate:  150 Edema:  no  Patient is without complaints. All questions were answered.  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 Discussed with L[facility].  May consider BMZ at some point

## 2013-12-20 LAB — DRUG SCREEN, URINE, NO CONFIRMATION
AMPHETAMINE SCRN UR: NEGATIVE
BARBITURATE QUANT UR: NEGATIVE
Benzodiazepines.: NEGATIVE
COCAINE METABOLITES: POSITIVE — AB
Creatinine,U: 44.9 mg/dL
Marijuana Metabolite: POSITIVE — AB
Methadone: NEGATIVE
Opiate Screen, Urine: NEGATIVE
Phencyclidine (PCP): NEGATIVE
Propoxyphene: NEGATIVE

## 2013-12-20 LAB — OXYCODONE SCREEN, UA, RFLX CONFIRM: OXYCODONE SCRN UR: NEGATIVE ng/mL

## 2014-01-01 ENCOUNTER — Inpatient Hospital Stay (HOSPITAL_COMMUNITY)
Admission: AD | Admit: 2014-01-01 | Discharge: 2014-01-03 | DRG: 775 | Disposition: A | Discharge status: Home / Self Care | Payer: Medicaid Other | Source: Ambulatory Visit | Attending: Family Medicine | Admitting: Family Medicine

## 2014-01-01 ENCOUNTER — Encounter (HOSPITAL_COMMUNITY): Payer: Self-pay | Admitting: *Deleted

## 2014-01-01 ENCOUNTER — Inpatient Hospital Stay (HOSPITAL_COMMUNITY): Payer: Medicaid Other

## 2014-01-01 DIAGNOSIS — O99344 Other mental disorders complicating childbirth: Secondary | ICD-10-CM | POA: Diagnosis present

## 2014-01-01 DIAGNOSIS — F1920 Other psychoactive substance dependence, uncomplicated: Secondary | ICD-10-CM

## 2014-01-01 DIAGNOSIS — O26872 Cervical shortening, second trimester: Secondary | ICD-10-CM

## 2014-01-01 DIAGNOSIS — O0930 Supervision of pregnancy with insufficient antenatal care, unspecified trimester: Secondary | ICD-10-CM

## 2014-01-01 DIAGNOSIS — F1410 Cocaine abuse, uncomplicated: Secondary | ICD-10-CM | POA: Diagnosis present

## 2014-01-01 DIAGNOSIS — F1210 Cannabis abuse, uncomplicated: Secondary | ICD-10-CM | POA: Diagnosis present

## 2014-01-01 DIAGNOSIS — IMO0001 Reserved for inherently not codable concepts without codable children: Secondary | ICD-10-CM

## 2014-01-01 DIAGNOSIS — O99334 Smoking (tobacco) complicating childbirth: Secondary | ICD-10-CM | POA: Diagnosis present

## 2014-01-01 DIAGNOSIS — O98212 Gonorrhea complicating pregnancy, second trimester: Secondary | ICD-10-CM

## 2014-01-01 DIAGNOSIS — O99891 Other specified diseases and conditions complicating pregnancy: Secondary | ICD-10-CM | POA: Diagnosis present

## 2014-01-01 DIAGNOSIS — Z3490 Encounter for supervision of normal pregnancy, unspecified, unspecified trimester: Secondary | ICD-10-CM

## 2014-01-01 DIAGNOSIS — O99320 Drug use complicating pregnancy, unspecified trimester: Secondary | ICD-10-CM

## 2014-01-01 DIAGNOSIS — O0992 Supervision of high risk pregnancy, unspecified, second trimester: Secondary | ICD-10-CM

## 2014-01-01 HISTORY — DX: Nicotine dependence, unspecified, uncomplicated: F17.200

## 2014-01-01 LAB — CBC
HCT: 30.2 % — ABNORMAL LOW (ref 36.0–46.0)
Hemoglobin: 10.4 g/dL — ABNORMAL LOW (ref 12.0–15.0)
MCH: 31.8 pg (ref 26.0–34.0)
MCHC: 34.4 g/dL (ref 30.0–36.0)
MCV: 92.4 fL (ref 78.0–100.0)
PLATELETS: 254 10*3/uL (ref 150–400)
RBC: 3.27 MIL/uL — ABNORMAL LOW (ref 3.87–5.11)
RDW: 13.6 % (ref 11.5–15.5)
WBC: 8 10*3/uL (ref 4.0–10.5)

## 2014-01-01 LAB — RAPID URINE DRUG SCREEN, HOSP PERFORMED
Amphetamines: POSITIVE — AB
BARBITURATES: NOT DETECTED
BENZODIAZEPINES: NOT DETECTED
Cocaine: POSITIVE — AB
Opiates: NOT DETECTED
Tetrahydrocannabinol: POSITIVE — AB

## 2014-01-01 LAB — TYPE AND SCREEN
ABO/RH(D): O POS
Antibody Screen: NEGATIVE

## 2014-01-01 LAB — OB RESULTS CONSOLE GBS: STREP GROUP B AG: NEGATIVE

## 2014-01-01 LAB — GROUP B STREP BY PCR: GROUP B STREP BY PCR: NEGATIVE

## 2014-01-01 MED ORDER — FLEET ENEMA 7-19 GM/118ML RE ENEM: 1.0000 | ENEMA | RECTAL | Status: DC | PRN

## 2014-01-01 MED ORDER — OXYTOCIN 40 UNITS IN LACTATED RINGERS INFUSION - SIMPLE MED
62.5000 mL/h | INTRAVENOUS | Status: DC
  Administered 2014-01-01: 999 mL/h via INTRAVENOUS
  Filled 2014-01-01: qty 1000

## 2014-01-01 MED ORDER — LACTATED RINGERS IV SOLN
INTRAVENOUS | Status: DC
  Administered 2014-01-01: 19:00:00 via INTRAVENOUS

## 2014-01-01 MED ORDER — LIDOCAINE HCL (PF) 1 % IJ SOLN
30.0000 mL | INTRAMUSCULAR | Status: DC | PRN
  Filled 2014-01-01: qty 30

## 2014-01-01 MED ORDER — INDOMETHACIN 25 MG PO CAPS
100.0000 mg | ORAL_CAPSULE | Freq: Once | ORAL | Status: AC
  Administered 2014-01-01: 100 mg via ORAL
  Filled 2014-01-01: qty 2

## 2014-01-01 MED ORDER — CEFAZOLIN SODIUM-DEXTROSE 2-3 GM-% IV SOLR
2.0000 g | Freq: Three times a day (TID) | INTRAVENOUS | Status: DC
  Administered 2014-01-01: 2 g via INTRAVENOUS
  Filled 2014-01-01 (×2): qty 50

## 2014-01-01 MED ORDER — ACETAMINOPHEN 325 MG PO TABS: 650.0000 mg | ORAL_TABLET | ORAL | Status: DC | PRN

## 2014-01-01 MED ORDER — MAGNESIUM SULFATE 40 G IN LACTATED RINGERS - SIMPLE
2.0000 g/h | INTRAVENOUS | Status: DC
  Administered 2014-01-01: 4 g/h via INTRAVENOUS
  Filled 2014-01-01: qty 500

## 2014-01-01 MED ORDER — INDOMETHACIN 25 MG PO CAPS
50.0000 mg | ORAL_CAPSULE | Freq: Four times a day (QID) | ORAL | Status: DC
  Filled 2014-01-01 (×2): qty 1

## 2014-01-01 MED ORDER — IBUPROFEN 600 MG PO TABS
600.0000 mg | ORAL_TABLET | Freq: Four times a day (QID) | ORAL | Status: DC
  Administered 2014-01-02 – 2014-01-03 (×6): 600 mg via ORAL
  Filled 2014-01-01 (×6): qty 1

## 2014-01-01 MED ORDER — OXYCODONE-ACETAMINOPHEN 5-325 MG PO TABS: 2.0000 | ORAL_TABLET | ORAL | Status: DC | PRN

## 2014-01-01 MED ORDER — CEFAZOLIN SODIUM 1-5 GM-% IV SOLN
1.0000 g | Freq: Three times a day (TID) | INTRAVENOUS | Status: DC
  Filled 2014-01-01: qty 50

## 2014-01-01 MED ORDER — OXYCODONE-ACETAMINOPHEN 5-325 MG PO TABS: 1.0000 | ORAL_TABLET | ORAL | Status: DC | PRN

## 2014-01-01 MED ORDER — CITRIC ACID-SODIUM CITRATE 334-500 MG/5ML PO SOLN
30.0000 mL | ORAL | Status: DC | PRN
Start: 2014-01-01 — End: 2014-01-02
  Filled 2014-01-01: qty 15

## 2014-01-01 MED ORDER — MAGNESIUM SULFATE BOLUS VIA INFUSION
4.0000 g | Freq: Once | INTRAVENOUS | Status: DC
  Filled 2014-01-01: qty 500

## 2014-01-01 MED ORDER — OXYCODONE-ACETAMINOPHEN 5-325 MG PO TABS
2.0000 | ORAL_TABLET | ORAL | Status: DC | PRN
  Administered 2014-01-02 – 2014-01-03 (×3): 2 via ORAL
  Filled 2014-01-01 (×3): qty 2

## 2014-01-01 MED ORDER — LACTATED RINGERS IV SOLN: 500.0000 mL | INTRAVENOUS | Status: DC | PRN

## 2014-01-01 MED ORDER — OXYTOCIN BOLUS FROM INFUSION: 500.0000 mL | INTRAVENOUS | Status: DC

## 2014-01-01 MED ORDER — NALOXONE HCL 0.4 MG/ML IJ SOLN
INTRAMUSCULAR | Status: AC
  Filled 2014-01-01: qty 1

## 2014-01-01 MED ORDER — BETAMETHASONE SOD PHOS & ACET 6 (3-3) MG/ML IJ SUSP
12.0000 mg | Freq: Once | INTRAMUSCULAR | Status: AC
  Administered 2014-01-01: 12 mg via INTRAMUSCULAR
  Filled 2014-01-01: qty 2

## 2014-01-01 MED ORDER — FENTANYL CITRATE 0.05 MG/ML IJ SOLN
100.0000 ug | INTRAMUSCULAR | Status: DC | PRN
  Administered 2014-01-01: 50 ug via INTRAVENOUS

## 2014-01-01 MED ORDER — BETAMETHASONE SOD PHOS & ACET 6 (3-3) MG/ML IJ SUSP: 12.0000 mg | Freq: Once | INTRAMUSCULAR | Status: DC

## 2014-01-01 MED ORDER — ONDANSETRON HCL 4 MG/2ML IJ SOLN: 4.0000 mg | Freq: Four times a day (QID) | INTRAMUSCULAR | Status: DC | PRN

## 2014-01-01 MED ORDER — FENTANYL CITRATE 0.05 MG/ML IJ SOLN
100.0000 ug | INTRAMUSCULAR | Status: DC | PRN
  Administered 2014-01-01: 100 ug via INTRAVENOUS
  Filled 2014-01-01 (×2): qty 2

## 2014-01-01 NOTE — H&P (Signed)
Faculty Practice Antenatal History and Physical  AMBIKA ZETTLEMOYER ZOX:[id number] DOB: 07/14/1985 DOA: 01/01/2014  Chief Complaint: Pelvic pressure  HPI: [name] is a 28 y.o. female G3P1011 with IUP at [redacted]w[redacted]d presenting for Pelvic pressure with membranes hanging out of vagina.  She started to feel cramping earlier today and thought that her water broke and that her baby was coming due to the intense pelvic pressure.  She called EMS, who evaluated her and saw intact membranes bulging out of her vagina.  She has a little thin bloody fluid from the vagina that is Fern negative.  She admits to cocaine use 2 days ago.  Prenatal History/Complications: Several positive urine drug screens for cocaine.  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  Review of Systems:   Pt complains of Contractions every two minutes.  Pt denies any vaginal discharge, decreased fetal movement, vaginal bleeding earlier today, fevers, chills, nausea, vomiting.  Review of systems are otherwise negative    Past Medical History: Past Medical History  Diagnosis Date  . Asthma   . Pregnant     Past Surgical History: Past Surgical History  Procedure Laterality Date  . Breat implants    . Breast enhancement surgery      Obstetrical History: OB History   Grav Para Term Preterm Abortions TAB SAB Ect Mult Living   0 1 1 0 0 0 1       Social History: History   Social History  . Marital Status: Single    Spouse Name: N/A    Number of Children: N/A  . Years of Education: N/A   Social History Main Topics  . Smoking status: Current Some Day Smoker -- 0.10 packs/day    Types: Cigarettes  . Smokeless tobacco: Not on file  . Alcohol Use: No  . Drug Use: No     Comment: Occasional, denies any use today,   . Sexual Activity: Not Currently    Birth Control/ Protection: None   Other Topics Concern  . Not on file   Social History Narrative  . No narrative on file    Family History: No  family history on file.  Allergies: Allergies  Allergen Reactions  . Penicillins Hives    Prescriptions prior to admission  Medication Sig Dispense Refill  . escitalopram (LEXAPRO) 10 MG tablet Take 1 tablet (10 mg total) by mouth daily.  30 tablet  6  . Prenatal Multivit-Min-Fe-FA (PRENATAL VITAMINS) 0.8 MG tablet Take 1 tablet by mouth daily.  30 tablet  12  . progesterone (PROMETRIUM) 200 MG capsule Place 1 capsule (200 mg total) vaginally daily.  30 capsule  4    Physical Exam: BP 119/92  Pulse 77  Temp(Src) 99.7 F (37.6 C) (Axillary)  Resp 20  LMP 07/05/2013  General appearance: alert, cooperative and mild distress Head: Normocephalic, without obvious abnormality, atraumatic Abdomen: soft, non-tender; bowel sounds normal; no masses,  no organomegaly.  Gravid to about 23cm Pelvic: Initially, bulging bag from vagina that has reduced.  On SVE, bulging membranes felt.  No cervix palpated.  No fetal parts in the vagina. Extremities: extremities normal, atraumatic, no cyanosis or edema Pulses: 2+ and symmetric Skin: Skin color, texture, turgor normal. No rashes or lesions Neurologic: Alert and oriented X 3, normal strength and tone. Normal symmetric reflexes. Normal coordination and gait Fetal Position: transverse Baseline: 140s bpm, Variability: Good {> 6 bpm), Accelerations: Non-reactive but appropriate for gestational age and Decelerations: Absent Contractions:  Initially, every 2 minutes.  Now approximately every 5 minutes  Dilation: 10 Effacement (%): 100 Exam by:: Dr. Adrian Blackwater           Labs on Admission:   CBC:  Recent Labs Lab 01/01/14 1845  WBC 8.0  HGB 10.4*  HCT 30.2*  MCV 92.4  PLT 254    CBG: No results found for this basename: GLUCAP,  in the last 168 hours  Radiological Exams on Admission: No results found.   Assessment/Plan Present on Admission:  . Active preterm labor  #1 Active Preterm Labor #2 IUP at 25.5 weeks #3 G3P1011 #4  Substance abuse in pregnancy  Patient placed in trendelenburg Magnesium Started - 4g bolus, 2g continuous Ancef 2g given for GBS prophylaxis, will continue while delivery imminent BMZ given at admission.  Will repeat in 24 hours if still pregnant. UDS GBS and GC/Chlamydia obtained. NICU notified.   Candelaria Celeste, DO Faculty Practice Attending Physician [facility] Attending Phone #: [phone]  **Disclaimer: This note may have been dictated with voice recognition software. Similar sounding words can inadvertently be transcribed and this note may contain transcription errors which may not have been corrected upon publication of note.**

## 2014-01-01 NOTE — Progress Notes (Signed)
[name] is a 28 y.o. G3P1011 at [redacted]w[redacted]d by LMP admitted for preterm labor and bulging membranes  Subjective: Continues to have contractions despite magnesium and the addition of indomethacin.  Feels contractions severely.  Having some bloody discharge  Objective: BP 120/56  Pulse 87  Temp(Src) 99.1 F (37.3 C) (Axillary)  Resp 20  Ht  (1.753 m)  Wt 67.132 kg (148 lb)  BMI 21.85 kg/m2  SpO2 100%  LMP 07/05/2013   Total I/O In: 415 [P.O.:60; I.V.:305; IV Piggyback:50] Out: 900 [Urine:900]  FHT:  FHR: 140 bpm, variability: minimal ,  accelerations:  Abscent,  decelerations:  Absent UC:   irregular, every 2-5 minutes SVE:   Dilation: 10 Effacement (%): 100 Exam by:: Dr. Adrian Blackwater  Labs: Lab Results  Component Value Date   WBC 8.0 01/01/2014   HGB 10.4* 01/01/2014   HCT 30.2* 01/01/2014   MCV 92.4 01/01/2014   PLT 254 01/01/2014    Assessment / Plan: Preterm Labor  Continue with magnesium, indomethacin. Fentanyl for pain UDS shows amphetamines, cocaine, THC. Vaginal cultures pending.  GBS negative. Category 2 tracing  STINSON, JACOB JEHIEL 01/01/2014, 10:50 PM

## 2014-01-02 ENCOUNTER — Encounter (HOSPITAL_COMMUNITY): Payer: Self-pay | Admitting: *Deleted

## 2014-01-02 DIAGNOSIS — IMO0001 Reserved for inherently not codable concepts without codable children: Secondary | ICD-10-CM

## 2014-01-02 LAB — CBC
HCT: 30.7 % — ABNORMAL LOW (ref 36.0–46.0)
HEMOGLOBIN: 10.6 g/dL — AB (ref 12.0–15.0)
MCH: 31.9 pg (ref 26.0–34.0)
MCHC: 34.5 g/dL (ref 30.0–36.0)
MCV: 92.5 fL (ref 78.0–100.0)
PLATELETS: 276 10*3/uL (ref 150–400)
RBC: 3.32 MIL/uL — AB (ref 3.87–5.11)
RDW: 13.4 % (ref 11.5–15.5)
WBC: 22.8 10*3/uL — AB (ref 4.0–10.5)

## 2014-01-02 LAB — GC/CHLAMYDIA PROBE AMP
CT Probe RNA: NEGATIVE
GC PROBE AMP APTIMA: NEGATIVE

## 2014-01-02 LAB — HIV ANTIBODY (ROUTINE TESTING W REFLEX): HIV: NONREACTIVE

## 2014-01-02 LAB — ABO/RH: ABO/RH(D): O POS

## 2014-01-02 LAB — SYPHILIS: RPR W/REFLEX TO RPR TITER AND TREPONEMAL ANTIBODIES, TRADITIONAL SCREENING AND DIAGNOSIS ALGORITHM

## 2014-01-02 MED ORDER — NICOTINE 7 MG/24HR TD PT24
7.0000 mg | MEDICATED_PATCH | Freq: Every day | TRANSDERMAL | Status: DC
  Administered 2014-01-02 (×2): 7 mg via TRANSDERMAL
  Filled 2014-01-02 (×2): qty 1

## 2014-01-02 MED ORDER — WITCH HAZEL-GLYCERIN EX PADS: 1.0000 "application " | MEDICATED_PAD | CUTANEOUS | Status: DC | PRN

## 2014-01-02 MED ORDER — PNEUMOCOCCAL VAC POLYVALENT 25 MCG/0.5ML IJ INJ
0.5000 mL | INJECTION | INTRAMUSCULAR | Status: AC
  Administered 2014-01-03: 0.5 mL via INTRAMUSCULAR
  Filled 2014-01-02: qty 0.5

## 2014-01-02 MED ORDER — SIMETHICONE 80 MG PO CHEW: 80.0000 mg | CHEWABLE_TABLET | ORAL | Status: DC | PRN

## 2014-01-02 MED ORDER — ZOLPIDEM TARTRATE 5 MG PO TABS: 5.0000 mg | ORAL_TABLET | Freq: Every evening | ORAL | Status: DC | PRN

## 2014-01-02 MED ORDER — ESCITALOPRAM OXALATE 10 MG PO TABS
10.0000 mg | ORAL_TABLET | Freq: Every day | ORAL | Status: DC
  Administered 2014-01-02 – 2014-01-03 (×2): 10 mg via ORAL
  Filled 2014-01-02 (×2): qty 1

## 2014-01-02 MED ORDER — PRENATAL MULTIVITAMIN CH
1.0000 | ORAL_TABLET | Freq: Every day | ORAL | Status: DC
  Administered 2014-01-02: 1 via ORAL
  Filled 2014-01-02: qty 1

## 2014-01-02 MED ORDER — PRENATAL MULTIVITAMIN CH: 1.0000 | ORAL_TABLET | Freq: Every day | ORAL | Status: DC

## 2014-01-02 MED ORDER — ONDANSETRON HCL 4 MG PO TABS: 4.0000 mg | ORAL_TABLET | ORAL | Status: DC | PRN

## 2014-01-02 MED ORDER — BENZOCAINE-MENTHOL 20-0.5 % EX AERO
1.0000 "application " | INHALATION_SPRAY | CUTANEOUS | Status: DC | PRN
  Administered 2014-01-02: 1 via TOPICAL
  Filled 2014-01-02: qty 56

## 2014-01-02 MED ORDER — INFLUENZA VAC SPLIT QUAD 0.5 ML IM SUSY
0.5000 mL | PREFILLED_SYRINGE | INTRAMUSCULAR | Status: AC
  Administered 2014-01-03: 0.5 mL via INTRAMUSCULAR
  Filled 2014-01-02: qty 0.5

## 2014-01-02 MED ORDER — SENNOSIDES-DOCUSATE SODIUM 8.6-50 MG PO TABS
2.0000 | ORAL_TABLET | ORAL | Status: DC
  Administered 2014-01-02 (×2): 2 via ORAL
  Filled 2014-01-02 (×2): qty 2

## 2014-01-02 MED ORDER — TETANUS-DIPHTH-ACELL PERTUSSIS 5-2.5-18.5 LF-MCG/0.5 IM SUSP
0.5000 mL | Freq: Once | INTRAMUSCULAR | Status: AC
  Administered 2014-01-02: 0.5 mL via INTRAMUSCULAR

## 2014-01-02 MED ORDER — DIPHENHYDRAMINE HCL 25 MG PO CAPS: 25.0000 mg | ORAL_CAPSULE | Freq: Four times a day (QID) | ORAL | Status: DC | PRN

## 2014-01-02 MED ORDER — DIBUCAINE 1 % RE OINT: 1.0000 "application " | TOPICAL_OINTMENT | RECTAL | Status: DC | PRN

## 2014-01-02 MED ORDER — LANOLIN HYDROUS EX OINT: TOPICAL_OINTMENT | CUTANEOUS | Status: DC | PRN

## 2014-01-02 MED ORDER — ONDANSETRON HCL 4 MG/2ML IJ SOLN: 4.0000 mg | INTRAMUSCULAR | Status: DC | PRN

## 2014-01-02 NOTE — Lactation Note (Signed)
This note was copied from the chart of Sandra Sherwood GamblerJessica Price. Lactation Consultation Note  Initial visit made.  This mom tested positive for amphetamines, THC and cocaine on admission.  RN had set up DEBP last night.  I had a discussion with mom regarding the danger of illegal substances which would be passed to baby through breast milk.  Explained that cocaine could possibly cause infant death.  Mom states that she completely understands.  She also states that she has no intension to continue any drug use.  We discussed that if she was sure of this plan she could pump and dump milk for 30 days.  Mom states she will follow recommendation to pump and dump.  She has an electric pump at home.  Reviewed supply and demand and how to maintain supply.  Patient Name: Sandra Sherwood GamblerJessica [facility] WUJWJ'XToday's Date: 01/02/2014 Reason for consult: Initial assessment;NICU baby;Other (Comment) (Mother positive for cocaine and THC)   Maternal Data    Feeding    LATCH Score/Interventions                      Lactation Tools Discussed/Used WIC Program: No Initiated by:: RN Date initiated:: 01/01/14   Consult Status      Huston FoleyMOULDEN, Arul Farabee S 01/02/2014, 3:41 PM

## 2014-01-02 NOTE — Progress Notes (Signed)
Clinical Social Work Department PSYCHOSOCIAL ASSESSMENT - MATERNAL/CHILD 2013/05/03  Patient:  [name]  Account Number:  [id number]  Admit Date:  11/24/13  Ardine Eng Name:   Docia Furl    Clinical Social Worker:  Terri Piedra, [place]   Date/Time:  April 05, 2014 01:30 PM  Date Referred:  November 27, 2013   Referral source  RN     Referred reason  Substance Abuse   Other referral source:    I:  FAMILY / HOME ENVIRONMENT Child's legal guardian:  PARENT  Guardian - Name Guardian - Age Guardian - Address  Guadalupe Dawn [address] [place], [place] [address]  Milus Height  same   Other household support members/support persons Other support:   MOB states her "mom" (not her biological mother, but the person who raised her since 2 months old) is a main support person, and also the person who cares for her 9 year old daughter.  MOB also states FOB is another main support person.    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   Financial resources:  Medicaid If Medicaid - County:  [employer] / Grade:   Maternity Care Coordinator / Child Services Coordination / Early Interventions:   CC4C, CDSA  Cultural issues impacting care:   MOB states her faith is very important to her.  She states she feels "as long as I pray, I think it will be okay." She reports going to church in a motel in [place].  She denies wanting to see someone from the [place] [facility] at [facility] for additional support.    III  STRENGTHS Strengths  Home prepared for Child (including basic supplies)  Other - See comment  Supportive family/friends   Strength comment:  MOB states she has Depression and takes Lexapro as prescribed, which she reports helps with her symptoms.  She reports having therapy in the past, but states, "I no longer need it."   IV  RISK FACTORS  AND CURRENT PROBLEMS Current Problem:  YES   Risk Factor & Current Problem Patient Issue Family Issue Risk Factor / Current Problem Comment  Substance Abuse Y N MOB was positive for Cocaine, Amphetamines and Marijuana on admission.  Three other drug screen during pregnancy were positive for Cocaine and Marijuana as well.  Mental Illness Y N MOB reports dx of Depression  Legal Involvement Y  MOB reports she currently has a lawyer-Steve Wade-who is involved due to speeding tickets.  Adjustment to Illness N N     V  SOCIAL WORK ASSESSMENT  CSW met with MOB in NICU as she was coming to see baby.  CSW had previously received message from RN that MOB had been already asking to speak with CSW.  CSW had already attempted to meet with MOB earlier today, but she was sleeping at that time.  CSW introduced to [facility] and asked if we could go back to her room to talk privately.  She was very pleasant and cooperative, but visibly nervous to speak with CSW.  She clutched her gown at the neck and her arms were trembling.  CSW asked her about her baby as we rode up the elevator.  A man, who appeared older than her, had been with her when she arrived to the NICU.  He did not accompany [place] to MOB's room, but stated that he was going down, instead of up.  CSW asked  who he was and she stated that he is the baby's father.  Once in MOB's room, CSW explained CSW's role as a support to NICU parents, but also explained that CSW was aware of things that we needed to discuss (drug use/positive screens).  CSW noted that CSW had been informed that MOB was asking to speak with CSW and asked if she would like to start the conversation.  MOB immediately started with whether or not she would be able to take her baby home.  CSW explained that this is not something that is solely determined by CSW and that CSW is not responsible for making the final plan for baby.  CSW also informed MOB that at this point, baby is stable, but at 25 weeks, cannot  avoid being critical.  MOB states she thinks baby will be fine and that he is now extubated (not her vernacular), and therefore "ok."  She asked how long baby will be in the hospital.  CSW explained that there is no way to know at this point, and that it is best to take things one day at a time, but informed her that keeping her due date in mind, is somewhat of a good estimate.  She stated understanding.  She was not interested in speaking further about her emotions related to her son's premature birth, what to expect from the NICU experience, or signs and symptoms of PPD at any great length.  She states she has Depression, but that it is well managed with Lexapro.  CSW commended MOB for seeking treatment for her symptoms.  CSW asked if she has a Social worker or if she would consider talking to one and she states that she has had one in the past, mostly related to feeling abandoned by her parents, and no longer needs one at this time.   CSW asked MOB about her drug use.  She minimized the subject repeatedly.  She states she didn't know she was pregnant early on, however, when asked, states she found out about the pregnancy in April.  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  I've never been heavy on it.  I only use it to stay up."  She denies pain pills or heroin.  CSW asked if there is any other drug she may have used during pregnancy, illicit or prescription, she states she took Adderall on occasion.  She admits that this was not prescribed to her, but took it from a friend, because it "helped me focus with school."  She states she does "homeschool."  When asked about this  in more detail, CSW gathers that she takes online glasses for her [employer], but MOB could not remember the name of the school of which she is enrolled.  MOB denies all other drugs (besides Lexapro, which we had already discussed.)  CSW asked if anyone had given her the results of her drug screen on admission.  She said no and that she would like these results.  CSW provided her with these and asked about a screen in the beginning of August that  was positive for Opiates.  She states she had a tooth abscess and was prescribed Hydrocodone.  CSW looked in her medicine record and notes this prescription for 12 pills on 11/05/13.  She states she did not take all of the medication, because she "can't take pills.  They make me throw up."  She states she crushes her Lexapro in her food in order to take it.  CSW asked if she used Cocaine to numb the pain from the tooth abscess and she admits that she did.  MOB reports that FOB has no issues with substance abuse.  MOB denies losing custody of her 26 year old and claims that she lives with her "mom" because MOB works so frequently.  CSW asked where MOB works and she said "anywhere I can get work."  CSW explained report that [place] will be making to [employer] due to current polysubstance abuse.  MOB highlighted the fact that she has not lied about her use and that she thinks baby will be able to go home with her.  CSW stated appreciation for her honesty, but also stated concerns for current drug use.  MOB states she is not addicted to drugs and that she has stopped.  She was not able to tell CSW the last time she used Cocaine.  She estimates her first use was 2 years ago.  She states willingness to do whatever is needed to be able to parent baby.  CSW explained what to expect, in general, from CPS involvement, with [facility] being a second option to appropriate natural supports (when it has been determined that a parent is not safe to parent currently),  and with reunification as always the ultimate goal.  CSW asked what the status of her relationship is with FOB and she replied "he helps me out."  She states she lives with him and that he is supportive.  CSW confirmed phone number and address.  MOB was tearful at times, but maintained that she does not feel she has a problem and that she should be rewarded for her truthfulness.        VI SOCIAL WORK PLAN Social Work Plan  Psychosocial Support/Ongoing Assessment of Needs  Patient/Family Education   Type of pt/family education:   Ongoing support services offered by NICU CSW/role of NICU CSW.  Hospital drug screen policy/Child Protective Services involvement due to positive drug screens.  Importance of monitoring emotional health/PPD signs and symptoms.   If child protective services report - county:  [place] If child protective services report - date:  01/02/2014 Information/referral to community resources comment:   CSW recommends substance abuse treatment as well as counseling for mental health.  MOB states she is willing to do anything necessary to be able to parent her child, but states she does not feel she is addicted to drugs and "can stop any time."  She states no plans to continue using, since she now has a son to care for.  She states she does not need a counselor to address Depression symptoms at this time.   Other social work plan:   CSW will monitor MDS results.  Baby's UDS is positive for Cocaine.  CSW will continue to be available for support/assistance as MOB/FOB allows and will follow up with CPS regarding case plan for parents/disposition plan for baby.

## 2014-01-02 NOTE — Progress Notes (Signed)
Post Partum Day 1 Subjective: no complaints, up ad lib, voiding and tolerating PO  Objective: Blood pressure 106/51, pulse 55, temperature 97.7 F (36.5 C), temperature source Oral, resp. rate 18, height 5\' 9"  (1.753 m), weight 67.132 kg (148 lb), last menstrual period 07/05/2013, SpO2 100.00%, unknown if currently breastfeeding.  Physical Exam:  General: alert, cooperative and no distress Lochia: appropriate Uterine Fundus: firm DVT Evaluation: No evidence of DVT seen on physical exam. Negative Homan's sign. No significant calf/ankle edema.   Recent Labs  01/01/14 1845 01/02/14 0536  HGB 10.4* 10.6*  HCT 30.2* 30.7*    Assessment/Plan: Plan for discharge tomorrow and Social Work consult   LOS: 1 day   Cheyanna Strick JEHIEL 01/02/2014, 7:15 AM

## 2014-01-02 NOTE — Progress Notes (Signed)
UR completed 

## 2014-01-03 ENCOUNTER — Other Ambulatory Visit: Payer: Medicaid Other

## 2014-01-03 ENCOUNTER — Encounter: Payer: Medicaid Other | Admitting: Advanced Practice Midwife

## 2014-01-03 MED ORDER — IBUPROFEN 600 MG PO TABS: 600.0000 mg | ORAL_TABLET | Freq: Four times a day (QID) | ORAL | Status: DC

## 2014-01-03 NOTE — Progress Notes (Signed)
Discharge instructions reviewed with patient.  Patient states understanding of home care, activity, medications, signs/symptoms to report to MD and return MD office visit.  Patients significant other will assist with her care @ home and no home equipment needed.  Patient ambulated for discharge in stable condition without incident.

## 2014-01-03 NOTE — Discharge Instructions (Signed)

## 2014-01-03 NOTE — Discharge Summary (Signed)
Obstetric Discharge Summary Reason for Admission: Active preterm labor at [redacted]w[redacted]d Prenatal Procedures: Betamethasone, Magnesium sulfate, Indomethacin, NST and Ultrasound Intrapartum Procedures: spontaneous vaginal delivery Postpartum Procedures: none Complications-Operative and Postpartum: none  Brief Hospital Course:  [name] is a 28 y.o. female G54P1011 with IUP at [redacted]w[redacted]d who presented on 01/01/14 for pelvic pressure with membranes hanging out of vagina. On evaluation, there was no cervix palpated.  She was admitted to L&D, and started on Betamethasone and given tocolysis with  Magnesium sulfate and Indomethacin. She continued to contract and ended up having a NSVD.  Delivery Note Called to patient's room due to increasing pressure.  On exam, membranes still intact with baby's head in the vaginal vault.  NICU team called to delivery room.  Patient began pushing and after several pushes her membranes ruptured spontaneously with mildly foul smelling fluid.  On the next contraction, at 11:17 PM, a viable female was delivered via Vaginal, Spontaneous Delivery (Presentation: Occiput Anterior).  The cord was clamped and cut and the baby was delivered to the warmer to the awaiting NICU team, who began to resuscitate the baby.  APGAR: 4, 5, 7; weight 1 lb 9.8 oz (731 g).   Placenta status: Intact, Spontaneous.  Placenta sent to pathology.  Cord: 3 vessels with the following complications: None.  On examination of the placenta, a clot was seen, which may represent a partial abruption. Anesthesia: None  Episiotomy: None Lacerations: None Est. Blood Loss (mL): 200 Mom to postpartum.  Baby to NICU.  Mom stable on my departure.  STINSON, JACOB JEHIEL 01/01/2014, 11:51 PM  Labs returned with UDS positive for cocaine and THC, SW consulted, please refer too their notes for more details.  Patient had an otherwise uncomplicated postpartum course and was discharged to home on PPD#2. She will follow up at  [facility].   Labs:  Results for orders placed during the hospital encounter of 01/01/14 (from the past 72 hour(s))  CBC     Status: Abnormal   Collection Time    01/01/14  6:45 PM      Result Value Ref Range   WBC 8.0  4.0 - 10.5 K/uL   RBC 3.27 (*) 3.87 - 5.11 MIL/uL   Hemoglobin 10.4 (*) 12.0 - 15.0 g/dL   HCT 54.0 (*) 98.1 - 19.1 %   MCV 92.4  78.0 - 100.0 fL   MCH 31.8  26.0 - 34.0 pg   MCHC 34.4  30.0 - 36.0 g/dL   RDW 47.8  29.5 - 62.1 %   Platelets 254  150 - 400 K/uL  RPR     Status: None   Collection Time    01/01/14  6:45 PM      Result Value Ref Range   RPR NON REAC  NON REAC   Comment: Performed at [facility]  HIV ANTIBODY (ROUTINE TESTING)     Status: None   Collection Time    01/01/14  6:45 PM      Result Value Ref Range   HIV 1&2 Ab, 4th Generation NONREACTIVE  NONREACTIVE  TYPE AND SCREEN     Status: None   Collection Time    01/01/14  6:45 PM      Result Value Ref Range   ABO/RH(D) O POS     Antibody Screen NEG     Sample Expiration 01/04/2014    ABO/RH     Status: None   Collection Time    01/01/14  6:45 PM  Result Value Ref Range   ABO/RH(D) O POS    GC/CHLAMYDIA PROBE AMP     Status: None   Collection Time    01/01/14  7:29 PM      Result Value Ref Range   CT Probe RNA NEGATIVE  NEGATIVE   GC Probe RNA NEGATIVE  NEGATIVE  GROUP B STREP BY PCR     Status: None   Collection Time    01/01/14  7:29 PM      Result Value Ref Range   Group B strep by PCR NEGATIVE  NEGATIVE  URINE RAPID DRUG SCREEN (HOSP PERFORMED)     Status: Abnormal   Collection Time    01/01/14  8:31 PM      Result Value Ref Range   Opiates NONE DETECTED  NONE DETECTED   Cocaine POSITIVE (*) NONE DETECTED   Benzodiazepines NONE DETECTED  NONE DETECTED   Amphetamines POSITIVE (*) NONE DETECTED   Tetrahydrocannabinol POSITIVE (*) NONE DETECTED   Barbiturates NONE DETECTED  NONE DETECTED  OB RESULTS CONSOLE GBS     Status: None   Collection Time    01/01/14   8:43 PM      Result Value Ref Range   GBS Negative    CBC     Status: Abnormal   Collection Time    01/02/14  5:36 AM      Result Value Ref Range   WBC 22.8 (*) 4.0 - 10.5 K/uL   RBC 3.32 (*) 3.87 - 5.11 MIL/uL   Hemoglobin 10.6 (*) 12.0 - 15.0 g/dL   HCT 16.130.7 (*) 09.636.0 - 04.546.0 %   MCV 92.5  78.0 - 100.0 fL   MCH 31.9  26.0 - 34.0 pg   MCHC 34.5  30.0 - 36.0 g/dL   RDW 40.913.4  81.111.5 - 91.415.5 %   Platelets 276  150 - 400 K/uL    Physical Exam:  Temp:  [97.9 F (36.6 C)-98.6 F (37 C)] 98 F (36.7 C) (09/30 0543) Pulse Rate:  [57-65] 57 (09/30 0543) Resp:  [18] 18 (09/30 0543) BP: (96-104)/(48-58) 96/50 mmHg (09/30 0543) SpO2:  [99 %-100 %] 99 % (09/30 0543)  General: alert and no distress Lochia: appropriate Uterine Fundus: firm DVT Evaluation: No evidence of DVT seen on physical exam. Negative Homan's sign.  Discharge Diagnoses: Preterm Pregnancy-delivered  Discharge Information: Date: 01/03/2014 Activity: pelvic rest as instructed Diet: routine Medications: PNV and Ibuprofen Condition: stable Instructions: refer to discharge instructions Discharge to: home and follow up with [facility]   Newborn Data: Live born female  Birth Weight: 1 lb 9.8 oz (731 g) APGAR: 4, 5 Admitted to NICU   Tereso NewcomerUgonna A Daysia Vandenboom, MD 01/03/2014, 10:32 AM

## 2014-01-16 ENCOUNTER — Telehealth: Payer: Self-pay | Admitting: Obstetrics and Gynecology

## 2014-01-16 NOTE — Telephone Encounter (Signed)
No call , chart review only

## 2014-02-05 ENCOUNTER — Encounter (HOSPITAL_COMMUNITY): Payer: Self-pay | Admitting: *Deleted

## 2014-02-07 ENCOUNTER — Encounter: Payer: Self-pay | Admitting: *Deleted

## 2014-02-26 ENCOUNTER — Ambulatory Visit: Payer: Medicaid Other | Admitting: Women's Health

## 2014-02-26 ENCOUNTER — Encounter: Payer: Self-pay | Admitting: Women's Health

## 2014-03-21 ENCOUNTER — Emergency Department (HOSPITAL_COMMUNITY): Payer: No Typology Code available for payment source

## 2014-03-21 ENCOUNTER — Encounter (HOSPITAL_COMMUNITY): Payer: Self-pay | Admitting: *Deleted

## 2014-03-21 ENCOUNTER — Emergency Department (HOSPITAL_COMMUNITY)
Admission: EM | Admit: 2014-03-21 | Discharge: 2014-03-21 | Discharge status: Against Medical Advice | Payer: No Typology Code available for payment source | Attending: Emergency Medicine | Admitting: Emergency Medicine

## 2014-03-21 DIAGNOSIS — Y9389 Activity, other specified: Secondary | ICD-10-CM | POA: Insufficient documentation

## 2014-03-21 DIAGNOSIS — W230XXA Caught, crushed, jammed, or pinched between moving objects, initial encounter: Secondary | ICD-10-CM | POA: Insufficient documentation

## 2014-03-21 DIAGNOSIS — Y998 Other external cause status: Secondary | ICD-10-CM | POA: Insufficient documentation

## 2014-03-21 DIAGNOSIS — Z720 Tobacco use: Secondary | ICD-10-CM | POA: Insufficient documentation

## 2014-03-21 DIAGNOSIS — S6992XA Unspecified injury of left wrist, hand and finger(s), initial encounter: Secondary | ICD-10-CM | POA: Insufficient documentation

## 2014-03-21 DIAGNOSIS — J45909 Unspecified asthma, uncomplicated: Secondary | ICD-10-CM | POA: Insufficient documentation

## 2014-03-21 DIAGNOSIS — Y929 Unspecified place or not applicable: Secondary | ICD-10-CM | POA: Insufficient documentation

## 2014-03-21 DIAGNOSIS — T1490XA Injury, unspecified, initial encounter: Secondary | ICD-10-CM

## 2014-03-21 NOTE — ED Notes (Signed)
Pt states she jammed her finger in car door last night.

## 2014-04-15 ENCOUNTER — Emergency Department (HOSPITAL_COMMUNITY)
Admission: EM | Admit: 2014-04-15 | Discharge: 2014-04-16 | Disposition: A | Discharge status: Home / Self Care | Payer: Self-pay | Attending: Emergency Medicine | Admitting: Emergency Medicine

## 2014-04-15 ENCOUNTER — Encounter (HOSPITAL_COMMUNITY): Payer: Self-pay | Admitting: Emergency Medicine

## 2014-04-15 ENCOUNTER — Emergency Department (HOSPITAL_COMMUNITY): Payer: Medicaid Other

## 2014-04-15 DIAGNOSIS — R109 Unspecified abdominal pain: Secondary | ICD-10-CM

## 2014-04-15 DIAGNOSIS — R52 Pain, unspecified: Secondary | ICD-10-CM

## 2014-04-15 DIAGNOSIS — Z79899 Other long term (current) drug therapy: Secondary | ICD-10-CM | POA: Insufficient documentation

## 2014-04-15 DIAGNOSIS — Z3202 Encounter for pregnancy test, result negative: Secondary | ICD-10-CM | POA: Insufficient documentation

## 2014-04-15 DIAGNOSIS — Z880 Allergy status to penicillin: Secondary | ICD-10-CM | POA: Insufficient documentation

## 2014-04-15 DIAGNOSIS — Z720 Tobacco use: Secondary | ICD-10-CM | POA: Insufficient documentation

## 2014-04-15 DIAGNOSIS — J45909 Unspecified asthma, uncomplicated: Secondary | ICD-10-CM | POA: Insufficient documentation

## 2014-04-15 DIAGNOSIS — N390 Urinary tract infection, site not specified: Secondary | ICD-10-CM | POA: Insufficient documentation

## 2014-04-15 LAB — CBC WITH DIFFERENTIAL/PLATELET
BASOS ABS: 0 10*3/uL (ref 0.0–0.1)
Basophils Relative: 1 % (ref 0–1)
Eosinophils Absolute: 0.1 10*3/uL (ref 0.0–0.7)
Eosinophils Relative: 1 % (ref 0–5)
HEMATOCRIT: 38.3 % (ref 36.0–46.0)
Hemoglobin: 12.8 g/dL (ref 12.0–15.0)
LYMPHS ABS: 1.2 10*3/uL (ref 0.7–4.0)
Lymphocytes Relative: 14 % (ref 12–46)
MCH: 31.1 pg (ref 26.0–34.0)
MCHC: 33.4 g/dL (ref 30.0–36.0)
MCV: 93 fL (ref 78.0–100.0)
MONOS PCT: 9 % (ref 3–12)
Monocytes Absolute: 0.8 10*3/uL (ref 0.1–1.0)
NEUTROS ABS: 6.4 10*3/uL (ref 1.7–7.7)
Neutrophils Relative %: 75 % (ref 43–77)
Platelets: 278 10*3/uL (ref 150–400)
RBC: 4.12 MIL/uL (ref 3.87–5.11)
RDW: 14.1 % (ref 11.5–15.5)
WBC: 8.6 10*3/uL (ref 4.0–10.5)

## 2014-04-15 LAB — URINALYSIS, ROUTINE W REFLEX MICROSCOPIC
Bilirubin Urine: NEGATIVE
Glucose, UA: NEGATIVE mg/dL
Ketones, ur: NEGATIVE mg/dL
Nitrite: POSITIVE — AB
Specific Gravity, Urine: <1.005 — ABNORMAL LOW (ref 1.005–1.030)
Urobilinogen, UA: 0.2 mg/dL (ref 0.0–1.0)
pH: 6 (ref 5.0–8.0)

## 2014-04-15 LAB — URINE MICROSCOPIC-ADD ON

## 2014-04-15 LAB — BASIC METABOLIC PANEL
Anion gap: 6 (ref 5–15)
BUN: 10 mg/dL (ref 6–23)
CALCIUM: 8.6 mg/dL (ref 8.4–10.5)
CO2: 26 mmol/L (ref 19–32)
Chloride: 101 mEq/L (ref 96–112)
Creatinine, Ser: 1 mg/dL (ref 0.50–1.10)
GFR calc Af Amer: 89 mL/min — ABNORMAL LOW (ref >90)
GFR, EST NON AFRICAN AMERICAN: 77 mL/min — AB (ref >90)
GLUCOSE: 88 mg/dL (ref 70–99)
Potassium: 3.5 mmol/L (ref 3.5–5.1)
SODIUM: 133 mmol/L — AB (ref 135–145)

## 2014-04-15 LAB — PREGNANCY, URINE: Preg Test, Ur: NEGATIVE

## 2014-04-15 MED ORDER — ONDANSETRON HCL 4 MG/2ML IJ SOLN
4.0000 mg | Freq: Once | INTRAMUSCULAR | Status: AC
  Administered 2014-04-15: 4 mg via INTRAVENOUS
  Filled 2014-04-15: qty 2

## 2014-04-15 MED ORDER — HYDROMORPHONE HCL 1 MG/ML IJ SOLN
1.0000 mg | Freq: Once | INTRAMUSCULAR | Status: AC
  Administered 2014-04-15: 1 mg via INTRAVENOUS
  Filled 2014-04-15: qty 1

## 2014-04-15 MED ORDER — CIPROFLOXACIN HCL 500 MG PO TABS: 500.0000 mg | ORAL_TABLET | Freq: Two times a day (BID) | ORAL | Status: DC

## 2014-04-15 MED ORDER — CIPROFLOXACIN IN D5W 400 MG/200ML IV SOLN
400.0000 mg | Freq: Once | INTRAVENOUS | Status: AC
  Administered 2014-04-15: 400 mg via INTRAVENOUS
  Filled 2014-04-15: qty 200

## 2014-04-15 MED ORDER — HYDROCODONE-ACETAMINOPHEN 5-325 MG PO TABS: 1.0000 | ORAL_TABLET | ORAL | Status: DC | PRN

## 2014-04-15 MED ORDER — KETOROLAC TROMETHAMINE 30 MG/ML IJ SOLN
30.0000 mg | Freq: Once | INTRAMUSCULAR | Status: AC
  Administered 2014-04-15: 30 mg via INTRAVENOUS
  Filled 2014-04-15: qty 1

## 2014-04-15 NOTE — ED Notes (Signed)
Patient complaining of left flank pain that started suddenly tonight. Denies urinary symptoms.

## 2014-04-15 NOTE — ED Provider Notes (Signed)
CT scan shows no evidence of kidney stone. Urinalysis shows obvious infection. Rx IV Cipro.  Patient is hemodynamically stable. Discharge medications Cipro and Vicodin  Donnetta HutchingBrian Versa Craton, MD 04/15/14 2344

## 2014-04-15 NOTE — ED Provider Notes (Signed)
CSN: [id number]     Arrival date & time 04/15/14  1921 History   First MD Initiated Contact with Patient 04/15/14 2100     Chief Complaint  Patient presents with  . Flank Pain     (Consider location/radiation/quality/duration/timing/severity/associated sxs/prior Treatment) Patient is a 29 y.o. female presenting with flank pain. The history is provided by the patient (pt complains of left flank pain).  Flank Pain This is a new problem. The current episode started 6 to 12 hours ago. The problem occurs constantly. The problem has not changed since onset.Pertinent negatives include no chest pain, no abdominal pain and no headaches. Nothing aggravates the symptoms. Nothing relieves the symptoms.    Past Medical History  Diagnosis Date  . Asthma   . Current smoker    Past Surgical History  Procedure Laterality Date  . Breat implants    . Breast enhancement surgery     History reviewed. No pertinent family history. History  Substance Use Topics  . Smoking status: Current Some Day Smoker -- 0.10 packs/day    Types: Cigarettes  . Smokeless tobacco: Never Used  . Alcohol Use: No   OB History    Gravida Para Term Preterm AB TAB SAB Ectopic Multiple Living   0 0 0 2     Review of Systems  Constitutional: Negative for appetite change and fatigue.  HENT: Negative for congestion, ear discharge and sinus pressure.   Eyes: Negative for discharge.  Respiratory: Negative for cough.   Cardiovascular: Negative for chest pain.  Gastrointestinal: Negative for abdominal pain and diarrhea.  Genitourinary: Positive for flank pain. Negative for frequency and hematuria.  Musculoskeletal: Negative for back pain.  Skin: Negative for rash.  Neurological: Negative for seizures and headaches.  Psychiatric/Behavioral: Negative for hallucinations.      Allergies  Penicillins  Home Medications   Prior to Admission medications   Medication Sig Start Date End Date Taking?  Authorizing Provider  BUPROPION HCL PO Take 1 tablet by mouth daily.   Yes Historical Provider, MD  TRAZODONE HCL PO Take 1 tablet by mouth at bedtime.   Yes Historical Provider, MD  ibuprofen (ADVIL,MOTRIN) 600 MG tablet Take 1 tablet (600 mg total) by mouth every 6 (six) hours. 01/03/14   Tereso Newcomer, MD  Prenatal Multivit-Min-Fe-FA (PRENATAL VITAMINS) 0.8 MG tablet Take 1 tablet by mouth daily. Patient not taking: Reported on 03/21/2014 12/19/13   Jacklyn Shell, CNM   BP 99/83 mmHg  Pulse 95  Temp(Src) 98.7 F (37.1 C) (Oral)  Resp 24  Ht  (1.753 m)  Wt 140 lb 3.2 oz (63.594 kg)  BMI 20.69 kg/m2  SpO2 100%  LMP 03/25/2014 Physical Exam  Constitutional: She is oriented to person, place, and time. She appears well-developed.  HENT:  Head: Normocephalic.  Eyes: Conjunctivae and EOM are normal. No scleral icterus.  Neck: Neck supple. No thyromegaly present.  Cardiovascular: Normal rate and regular rhythm.  Exam reveals no gallop and no friction rub.   No murmur heard. Pulmonary/Chest: No stridor. She has no wheezes. She has no rales. She exhibits no tenderness.  Abdominal: She exhibits no distension. There is no tenderness. There is no rebound.  Musculoskeletal: Normal range of motion. She exhibits no edema.  Tender left flank  Lymphadenopathy:    She has no cervical adenopathy.  Neurological: She is oriented to person, place, and time. She exhibits normal muscle tone. Coordination normal.  Skin: No rash noted. No erythema.  Psychiatric: She has a normal mood and affect. Her behavior is normal.    ED Course  Procedures (including critical care time) Labs Review Labs Reviewed  BASIC METABOLIC PANEL - Abnormal; Notable for the following:    Sodium 133 (*)    GFR calc non Af Amer 77 (*)    GFR calc Af Amer 89 (*)    All other components within normal limits  CBC WITH DIFFERENTIAL  URINALYSIS, ROUTINE W REFLEX MICROSCOPIC  PREGNANCY, URINE    Imaging  Review No results found.   EKG Interpretation None      MDM   Final diagnoses:  None      Flank pain  Benny LennertJoseph L Melane Windholz, MD 04/18/14 1721

## 2014-04-15 NOTE — Discharge Instructions (Signed)
You have a urinary tract infection.   Increase fluids. Prescription for antibiotic and pain medicine. No evidence of a kidney stone. Start antibiotic tomorrow morning.  First dose given in the IV.

## 2014-04-18 LAB — URINE CULTURE

## 2014-04-19 ENCOUNTER — Telehealth (HOSPITAL_BASED_OUTPATIENT_CLINIC_OR_DEPARTMENT_OTHER): Payer: Self-pay | Admitting: Emergency Medicine

## 2014-04-19 NOTE — Telephone Encounter (Signed)
Post ED Visit - Positive Culture Follow-up  Culture report reviewed by antimicrobial stewardship pharmacist: []  Wes Dulaney, Pharm.D., BCPS []  Celedonio MiyamotoJeremy Frens, Pharm.D., BCPS []  Georgina PillionElizabeth [place], [address] BCPS []  AbbevilleMinh Pham, [address]Ph[address] BCPS, AAHIVP []  Estella HuskMichelle Turner, Pharm.D., BCPS, AAHIVP [x]  Elder CyphersLorie Poole, [address] BCPS  Positive urine culture E. Coli Treated with ciprofloxacin , organism sensitive to the same and no further patient follow-up is required at this time.  Berle MullMiller, Andrue Dini 04/19/2014, 3:28 PM

## 2016-04-06 ENCOUNTER — Encounter (HOSPITAL_COMMUNITY): Payer: Self-pay | Admitting: Emergency Medicine

## 2016-04-06 ENCOUNTER — Emergency Department (HOSPITAL_COMMUNITY)
Admission: EM | Admit: 2016-04-06 | Discharge: 2016-04-06 | Disposition: A | Discharge status: Home / Self Care | Payer: Medicaid Other | Attending: Emergency Medicine | Admitting: Emergency Medicine

## 2016-04-06 DIAGNOSIS — Z79899 Other long term (current) drug therapy: Secondary | ICD-10-CM | POA: Insufficient documentation

## 2016-04-06 DIAGNOSIS — T7421XA Adult sexual abuse, confirmed, initial encounter: Secondary | ICD-10-CM | POA: Insufficient documentation

## 2016-04-06 DIAGNOSIS — F17210 Nicotine dependence, cigarettes, uncomplicated: Secondary | ICD-10-CM | POA: Insufficient documentation

## 2016-04-06 DIAGNOSIS — J45909 Unspecified asthma, uncomplicated: Secondary | ICD-10-CM | POA: Insufficient documentation

## 2016-04-06 MED ORDER — LIDOCAINE HCL (PF) 1 % IJ SOLN
0.9000 mL | Freq: Once | INTRAMUSCULAR | Status: AC
  Administered 2016-04-06: 0.9 mL

## 2016-04-06 MED ORDER — CEFTRIAXONE SODIUM 250 MG IJ SOLR
250.0000 mg | Freq: Once | INTRAMUSCULAR | Status: AC
  Administered 2016-04-06: 250 mg via INTRAMUSCULAR

## 2016-04-06 MED ORDER — AZITHROMYCIN 250 MG PO TABS
1000.0000 mg | ORAL_TABLET | Freq: Once | ORAL | Status: AC
  Administered 2016-04-06: 1000 mg via ORAL

## 2016-04-06 MED ORDER — METRONIDAZOLE 500 MG PO TABS
2000.0000 mg | ORAL_TABLET | Freq: Once | ORAL | Status: AC
  Administered 2016-04-06: 2000 mg via ORAL

## 2016-04-06 MED ORDER — ULIPRISTAL ACETATE 30 MG PO TABS
30.0000 mg | ORAL_TABLET | Freq: Once | ORAL | Status: AC
  Administered 2016-04-06: 30 mg via ORAL
  Filled 2016-04-06: qty 1

## 2016-04-06 MED ORDER — PROMETHAZINE HCL 25 MG PO TABS
25.0000 mg | ORAL_TABLET | Freq: Four times a day (QID) | ORAL | Status: DC | PRN
  Administered 2016-04-06: 25 mg via ORAL

## 2016-04-06 NOTE — ED Notes (Signed)
SANE arrived.

## 2016-04-06 NOTE — Discharge Instructions (Signed)
For all of the medications you have received:  AVOID HAVING SEXUAL CONTACT UNTIL FOLLOW UP STI TESTING IS DONE.  IF YOU HAVE CONTACTED A SEXUALLY TRANSMITTED INFECTION, YOUR PARTNER CAN BECOME INFECTED.  Do not share any of these medications with others.  Store at room temperature, away from light and moisture.  Do not store in the bathroom.  Keep all medicines away from children and pets.  Do not flush medications down the toilet or pour them in the drain.  Properly discard (contact a pharmacy) when a medication is expired or no longer needed.  Azithromycin tablets What is this medicine? AZITHROMYCIN (az ith roe MYE sin) is a macrolide antibiotic. It is used to treat or prevent certain kinds of bacterial infections. It will not work for colds, flu, or other viral infections. This medicine may be used for other purposes; ask your health care provider or pharmacist if you have questions. COMMON BRAND NAME(S): Zithromax, Zithromax Tri-Pak, Zithromax Z-Pak What should I tell my health care provider before I take this medicine? They need to know if you have any of these conditions: -kidney disease -liver disease -irregular heartbeat or heart disease -an unusual or allergic reaction to azithromycin, erythromycin, other macrolide antibiotics, foods, dyes, or preservatives -pregnant or trying to get pregnant -breast-feeding How should I use this medicine? Take this medicine by mouth with a full glass of water. Follow the directions on the prescription label. The tablets can be taken with food or on an empty stomach. If the medicine upsets your stomach, take it with food. Take your medicine at regular intervals. Do not take your medicine more often than directed. Take all of your medicine as directed even if you think your are better. Do not skip doses or stop your medicine early. Talk to your pediatrician regarding the use of this medicine in children. While this drug may be prescribed for children as  young as 6 months for selected conditions, precautions do apply. Overdosage: If you think you have taken too much of this medicine contact a poison control center or emergency room at once. NOTE: This medicine is only for you. Do not share this medicine with others. What if I miss a dose? If you miss a dose, take it as soon as you can. If it is almost time for your next dose, take only that dose. Do not take double or extra doses. What may interact with this medicine? Do not take this medicine with any of the following medications: -lincomycin This medicine may also interact with the following medications: -amiodarone -antacids -birth control pills -cyclosporine -digoxin -magnesium -nelfinavir -phenytoin -warfarin This list may not describe all possible interactions. Give your health care provider a list of all the medicines, herbs, non-prescription drugs, or dietary supplements you use. Also tell them if you smoke, drink alcohol, or use illegal drugs. Some items may interact with your medicine. What should I watch for while using this medicine? Tell your doctor or healthcare professional if your symptoms do not start to get better or if they get worse. Do not treat diarrhea with over the counter products. Contact your doctor if you have diarrhea that lasts more than 2 days or if it is severe and watery. This medicine can make you more sensitive to the sun. Keep out of the sun. If you cannot avoid being in the sun, wear protective clothing and use sunscreen. Do not use sun lamps or tanning beds/booths. What side effects may I notice from receiving this medicine?   Side effects that you should report to your doctor or health care professional as soon as possible: -allergic reactions like skin rash, itching or hives, swelling of the face, lips, or tongue -confusion, nightmares or hallucinations -dark urine -difficulty breathing -hearing loss -irregular heartbeat or chest pain -pain or  difficulty passing urine -redness, blistering, peeling or loosening of the skin, including inside the mouth -white patches or sores in the mouth -yellowing of the eyes or skin Side effects that usually do not require medical attention (report to your doctor or health care professional if they continue or are bothersome): -diarrhea -dizziness, drowsiness -headache -stomach upset or vomiting -tooth discoloration -vaginal irritation This list may not describe all possible side effects. Call your doctor for medical advice about side effects. You may report side effects to FDA at 1-800-FDA-1088. Where should I keep my medicine? Keep out of the reach of children. Store at room temperature between 15 and 30 degrees C (59 and 86 degrees F). Throw away any unused medicine after the expiration date. NOTE: This sheet is a summary. It may not cover all possible information. If you have questions about this medicine, talk to your doctor, pharmacist, or health care provider.  2017 [employer]/Gold Standard (2015-05-21 15:26:03)  Ulipristal oral tablets (Ella) What is this medicine? ULIPRISTAL (UE li pris tal) is an emergency contraceptive. It prevents pregnancy if taken within 5 days (120 hours) after your birth control fails or you have unprotected sex. This medicine will not work if you are already pregnant. COMMON BRAND NAME(S): ella What should I tell my health care provider before I take this medicine? They need to know if you have any of these conditions: -an unusual or allergic reaction to ulipristal, other medicines, foods, dyes, or preservatives -pregnant or trying to get pregnant -breast-feeding How should I use this medicine? Take this medicine by mouth with or without food. Your doctor may want you to use a quick-response pregnancy test prior to using the tablets. Take your medicine as soon as possible and not more than 5 days (120 hours) after the event. This medicine can be taken at any  time during your menstrual cycle. Follow the dose instructions of your health care provider exactly. Contact your health care provider right away if you vomit within 3 hours of taking your medicine to discuss if you need to take another tablet. A patient package insert for the product will be given with each prescription and refill. Read this sheet carefully each time. The sheet may change frequently. Contact your pediatrician regarding the use of this medicine in children. Special care may be needed. What if I miss a dose? This does not apply; this medicine is not for regular use. What may interact with this medicine? This medicine may interact with the following medications: -birth control pills -bosentan -certain medicines for fungal infections like griseofulvin, itraconazole, and ketoconazole -certain medicines for seizures like barbiturates, carbamazepine, felbamate, oxcarbazepine, phenytoin, topiramate -dabigatran -digoxin -rifampin -St. John's Wort What should I watch for while using this medicine? Your period may begin a few days earlier or later than expected. If your period is more than 7 days late, pregnancy is possible. See your health care provider as soon as you can and get a pregnancy test. Talk to your healthcare provider before taking this medicine if you know or suspect that you are pregnant. Contact your healthcare provider if you think you may be pregnant and you have taken this medicine. Your healthcare provider may wish to provide   information on your pregnancy to help study the safety of this medicine during pregnancy. For information, go to [email or web address]. If you have severe abdominal pain about 3 to 5 weeks after taking this medicine, you may have a pregnancy outside the womb, which is called an ectopic or tubal pregnancy. Call your health care provider or go to the nearest emergency room right away if you think this is happening. Discuss birth control options with your  health care provider. Emergency birth control is not to be used routinely to prevent pregnancy. It should not be used more than once in the same cycle. Birth control pills may not work properly while you are taking this medicine. Wait at least 5 days after taking this medicine to start or continue other hormone based birth control. Be sure to use a reliable barrier contraceptive method (such as a condom with spermicide) between the time you take this medicine and your next period. This medicine does not protect you against HIV infection (AIDS) or any other sexually transmitted diseases (STDs). What side effects may I notice from receiving this medicine? Side effects that you should report to your doctor or health care professional as soon as possible: -allergic reactions like skin rash, itching or hives, swelling of the face, lips, or tongue Side effects that usually do not require medical attention (report to your doctor or health care professional if they continue or are bothersome): -dizziness -headache -nausea -spotting -stomach pain -tiredness Where should I keep my medicine? Keep out of the reach of children. Store at between 20 and 25 degrees C (68 and 77 degrees F). Protect from light and keep in the blister card inside the original box until you are ready to take it. Throw away any unused medicine after the expiration date.  2017 [employer]/Gold Standard (2015-04-25 10:39:30)  Metronidazole (4 pills at once) Also known as:  Flagyl   Metronidazole tablets or capsules What is this medicine? METRONIDAZOLE (me troe NI da zole) is an antiinfective. It is used to treat certain kinds of bacterial and protozoal infections. It will not work for colds, flu, or other viral infections. This medicine may be used for other purposes; ask your health care provider or pharmacist if you have questions. COMMON BRAND NAME(S): Flagyl What should I tell my health care provider before I take this  medicine? They need to know if you have any of these conditions: -anemia or other blood disorders -disease of the nervous system -fungal or yeast infection -if you drink alcohol containing drinks -liver disease -seizures -an unusual or allergic reaction to metronidazole, or other medicines, foods, dyes, or preservatives -pregnant or trying to get pregnant -breast-feeding How should I use this medicine? Take this medicine by mouth with a full glass of water. Follow the directions on the prescription label. Take your medicine at regular intervals. Do not take your medicine more often than directed. Take all of your medicine as directed even if you think you are better. Do not skip doses or stop your medicine early. Talk to your pediatrician regarding the use of this medicine in children. Special care may be needed. Overdosage: If you think you have taken too much of this medicine contact a poison control center or emergency room at once. NOTE: This medicine is only for you. Do not share this medicine with others. What if I miss a dose? If you miss a dose, take it as soon as you can. If it is almost time for your next dose, take only   that dose. Do not take double or extra doses. What may interact with this medicine? Do not take this medicine with any of the following medications: -alcohol or any product that contains alcohol -amprenavir oral solution -cisapride -disulfiram -dofetilide -dronedarone -paclitaxel injection -pimozide -ritonavir oral solution -sertraline oral solution -sulfamethoxazole-trimethoprim injection -thioridazine -ziprasidone This medicine may also interact with the following medications: -birth control pills -cimetidine -lithium -other medicines that prolong the QT interval (cause an abnormal heart rhythm) -phenobarbital -phenytoin -warfarin This list may not describe all possible interactions. Give your health care provider a list of all the medicines,  herbs, non-prescription drugs, or dietary supplements you use. Also tell them if you smoke, drink alcohol, or use illegal drugs. Some items may interact with your medicine. What should I watch for while using this medicine? Tell your doctor or health care professional if your symptoms do not improve or if they get worse. You may get drowsy or dizzy. Do not drive, use machinery, or do anything that needs mental alertness until you know how this medicine affects you. Do not stand or sit up quickly, especially if you are an older patient. This reduces the risk of dizzy or fainting spells. Avoid alcoholic drinks while you are taking this medicine and for three days afterward. Alcohol may make you feel dizzy, sick, or flushed. If you are being treated for a sexually transmitted disease, avoid sexual contact until you have finished your treatment. Your sexual partner may also need treatment. What side effects may I notice from receiving this medicine? Side effects that you should report to your doctor or health care professional as soon as possible: -allergic reactions like skin rash or hives, swelling of the face, lips, or tongue -confusion, clumsiness -difficulty speaking -discolored or sore mouth -dizziness -fever, infection -numbness, tingling, pain or weakness in the hands or feet -trouble passing urine or change in the amount of urine -redness, blistering, peeling or loosening of the skin, including inside the mouth -seizures -unusually weak or tired -vaginal irritation, dryness, or discharge Side effects that usually do not require medical attention (report to your doctor or health care professional if they continue or are bothersome): -diarrhea -headache -irritability -metallic taste -nausea -stomach pain or cramps -trouble sleeping This list may not describe all possible side effects. Call your doctor for medical advice about side effects. You may report side effects to FDA at  1-800-FDA-1088. Where should I keep my medicine? Keep out of the reach of children. Store at room temperature below 25 degrees C (77 degrees F). Protect from light. Keep container tightly closed. Throw away any unused medicine after the expiration date. NOTE: This sheet is a summary. It may not cover all possible information. If you have questions about this medicine, talk to your doctor, pharmacist, or health care provider.  2017 [employer]/Gold Standard (2012-10-28 14:08:39)   Promethazine (pack of 3 for home use) Also known as:  Phenergan  Promethazine tablets What is this medicine? PROMETHAZINE (proe METH a zeen) is an antihistamine. It is used to treat allergic reactions and to treat or prevent nausea and vomiting from illness or motion sickness. It is also used to make you sleep before surgery, and to help treat pain or nausea after surgery. This medicine may be used for other purposes; ask your health care provider or pharmacist if you have questions. COMMON BRAND NAME(S): Phenergan What should I tell my health care provider before I take this medicine? They need to know if you have any of these  conditions: -glaucoma -high blood pressure or heart disease -kidney disease -liver disease -lung or breathing disease, like asthma -prostate trouble -pain or difficulty passing urine -seizures -an unusual or allergic reaction to promethazine or phenothiazines, other medicines, foods, dyes, or preservatives -pregnant or trying to get pregnant -breast-feeding How should I use this medicine? Take this medicine by mouth with a glass of water. Follow the directions on the prescription label. Take your doses at regular intervals. Do not take your medicine more often than directed. Talk to your pediatrician regarding the use of this medicine in children. Special care may be needed. This medicine should not be given to infants and children younger than 2 years old. Overdosage: If you think you  have taken too much of this medicine contact a poison control center or emergency room at once. NOTE: This medicine is only for you. Do not share this medicine with others. What if I miss a dose? If you miss a dose, take it as soon as you can. If it is almost time for your next dose, take only that dose. Do not take double or extra doses. What may interact with this medicine? Do not take this medicine with any of the following medications: -cisapride -dofetilide -dronedarone -MAOIs like Carbex, Eldepryl, Marplan, Nardil, Parnate -pimozide -quinidine, including dextromethorphan; quinidine -thioridazine -ziprasidone This medicine may also interact with the following medications: -certain medicines for depression, anxiety, or psychotic disturbances -certain medicines for anxiety or sleep -certain medicines for seizures like carbamazepine, phenobarbital, phenytoin -certain medicines for movement abnormalities as in Parkinson's disease, or for gastrointestinal problems -epinephrine -medicines for allergies or colds -muscle relaxants -narcotic medicines for pain -other medicines that prolong the QT interval (cause an abnormal heart rhythm) -tramadol -trimethobenzamide This list may not describe all possible interactions. Give your health care provider a list of all the medicines, herbs, non-prescription drugs, or dietary supplements you use. Also tell them if you smoke, drink alcohol, or use illegal drugs. Some items may interact with your medicine. What should I watch for while using this medicine? Tell your doctor or health care professional if your symptoms do not start to get better in 1 to 2 days. You may get drowsy or dizzy. Do not drive, use machinery, or do anything that needs mental alertness until you know how this medicine affects you. To reduce the risk of dizzy or fainting spells, do not stand or sit up quickly, especially if you are an older patient. Alcohol may increase  dizziness and drowsiness. Avoid alcoholic drinks. Your mouth may get dry. Chewing sugarless gum or sucking hard candy, and drinking plenty of water may help. Contact your doctor if the problem does not go away or is severe. This medicine may cause dry eyes and blurred vision. If you wear contact lenses you may feel some discomfort. Lubricating drops may help. See your eye doctor if the problem does not go away or is severe. This medicine can make you more sensitive to the sun. Keep out of the sun. If you cannot avoid being in the sun, wear protective clothing and use sunscreen. Do not use sun lamps or tanning beds/booths. If you are diabetic, check your blood-sugar levels regularly. What side effects may I notice from receiving this medicine? Side effects that you should report to your doctor or health care professional as soon as possible: -blurred vision -irregular heartbeat, palpitations or chest pain -muscle or facial twitches -pain or difficulty passing urine -seizures -skin rash -slowed or shallow breathing -unusual bleeding   or bruising -yellowing of the eyes or skin Side effects that usually do not require medical attention (report to your doctor or health care professional if they continue or are bothersome): -headache -nightmares, agitation, nervousness, excitability, not able to sleep (these are more likely in children) -stuffy nose This list may not describe all possible side effects. Call your doctor for medical advice about side effects. You may report side effects to FDA at 1-800-FDA-1088. Where should I keep my medicine? Keep out of the reach of children. Store at room temperature, between 20 and 25 degrees C (68 and 77 degrees F). Protect from light. Throw away any unused medicine after the expiration date. NOTE: This sheet is a summary. It may not cover all possible information. If you have questions about this medicine, talk to your doctor, pharmacist, or health care  provider.  2017 [employer]/Gold Standard (2012-11-22 15:04:46)   Ceftriaxone (Injection/Shot) Also known as:  Rocephin  Ceftriaxone injection What is this medicine? CEFTRIAXONE (sef try AX one) is a cephalosporin antibiotic. It is used to treat certain kinds of bacterial infections. It will not work for colds, flu, or other viral infections. This medicine may be used for other purposes; ask your health care provider or pharmacist if you have questions. COMMON BRAND NAME(S): Rocephin What should I tell my health care provider before I take this medicine? They need to know if you have any of these conditions: -any chronic illness -bowel disease, like colitis -both kidney and liver disease -high bilirubin level in newborn patients -an unusual or allergic reaction to ceftriaxone, other cephalosporin or penicillin antibiotics, foods, dyes, or preservatives -pregnant or trying to get pregnant -breast-feeding How should I use this medicine? This medicine is injected into a muscle or infused it into a vein. It is usually given in a medical office or clinic. If you are to give this medicine you will be taught how to inject it. Follow instructions carefully. Use your doses at regular intervals. Do not take your medicine more often than directed. Do not skip doses or stop your medicine early even if you feel better. Do not stop taking except on your doctor's advice. Talk to your pediatrician regarding the use of this medicine in children. Special care may be needed. Overdosage: If you think you have taken too much of this medicine contact a poison control center or emergency room at once. NOTE: This medicine is only for you. Do not share this medicine with others. What if I miss a dose? If you miss a dose, take it as soon as you can. If it is almost time for your next dose, take only that dose. Do not take double or extra doses. What may interact with this medicine? Do not take this medicine with  any of the following medications: -intravenous calcium This medicine may also interact with the following medications: -birth control pills This list may not describe all possible interactions. Give your health care provider a list of all the medicines, herbs, non-prescription drugs, or dietary supplements you use. Also tell them if you smoke, drink alcohol, or use illegal drugs. Some items may interact with your medicine. What should I watch for while using this medicine? Tell your doctor or health care professional if your symptoms do not improve or if they get worse. Do not treat diarrhea with over the counter products. Contact your doctor if you have diarrhea that lasts more than 2 days or if it is severe and watery. If you are being treated  for a sexually transmitted disease, avoid sexual contact until you have finished your treatment. Having sex can infect your sexual partner. Calcium may bind to this medicine and cause lung or kidney problems. Avoid calcium products while taking this medicine and for 48 hours after taking the last dose of this medicine. What side effects may I notice from receiving this medicine? Side effects that you should report to your doctor or health care professional as soon as possible: -allergic reactions like skin rash, itching or hives, swelling of the face, lips, or tongue -breathing problems -fever, chills -irregular heartbeat -pain when passing urine -seizures -stomach pain, cramps -unusual bleeding, bruising -unusually weak or tired Side effects that usually do not require medical attention (report to your doctor or health care professional if they continue or are bothersome): -diarrhea -dizzy, drowsy -headache -nausea, vomiting -pain, swelling, irritation where injected -stomach upset -sweating This list may not describe all possible side effects. Call your doctor for medical advice about side effects. You may report side effects to FDA at  1-800-FDA-1088. Where should I keep my medicine? Keep out of the reach of children. Store at room temperature below 25 degrees C (77 degrees F). Protect from light. Throw away any unused vials after the expiration date. NOTE: This sheet is a summary. It may not cover all possible information. If you have questions about this medicine, talk to your doctor, pharmacist, or health care provider.  2017 [employer] Standard (2013-10-09 09:14:54)       Sexual Assault Sexual Assault is an unwanted sexual act or contact made against you by another person.  You may not agree to the contact, or you may agree to it because you are pressured, forced, or threatened.  You may have agreed to it when you could not think clearly, such as after drinking alcohol or using drugs.  Sexual assault can include unwanted touching of your genital areas (vagina or penis), assault by penetration (when an object is forced into the vagina or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

## 2016-04-06 NOTE — ED Notes (Signed)
POC urine pregnancy was negative. 

## 2016-04-06 NOTE — ED Notes (Signed)
ED Provider at bedside. 

## 2016-04-06 NOTE — ED Notes (Signed)
[employer] left case number: [id number]
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
## 2016-04-06 NOTE — ED Notes (Signed)
[employer] at bedside 
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
## 2016-04-07 NOTE — Consult Note (Signed)
[facility] via [facility], at 12:16 on 04/07/16.

## 2016-04-20 ENCOUNTER — Ambulatory Visit: Payer: Medicaid Other

## 2016-10-29 ENCOUNTER — Emergency Department (HOSPITAL_COMMUNITY): Payer: Self-pay

## 2016-10-29 ENCOUNTER — Inpatient Hospital Stay (HOSPITAL_COMMUNITY)
Admission: AD | Admit: 2016-10-29 | Discharge: 2016-10-30 | DRG: 770 | Discharge status: Against Medical Advice | Payer: Self-pay | Source: Ambulatory Visit | Attending: Obstetrics and Gynecology | Admitting: Obstetrics and Gynecology

## 2016-10-29 ENCOUNTER — Encounter (HOSPITAL_COMMUNITY): Payer: Self-pay

## 2016-10-29 DIAGNOSIS — O039 Complete or unspecified spontaneous abortion without complication: Secondary | ICD-10-CM | POA: Diagnosis present

## 2016-10-29 DIAGNOSIS — N939 Abnormal uterine and vaginal bleeding, unspecified: Secondary | ICD-10-CM

## 2016-10-29 DIAGNOSIS — O034 Incomplete spontaneous abortion without complication: Secondary | ICD-10-CM

## 2016-10-29 DIAGNOSIS — Z5321 Procedure and treatment not carried out due to patient leaving prior to being seen by health care provider: Secondary | ICD-10-CM | POA: Diagnosis present

## 2016-10-29 DIAGNOSIS — O4690 Antepartum hemorrhage, unspecified, unspecified trimester: Secondary | ICD-10-CM

## 2016-10-29 DIAGNOSIS — D649 Anemia, unspecified: Secondary | ICD-10-CM | POA: Diagnosis present

## 2016-10-29 DIAGNOSIS — A400 Sepsis due to streptococcus, group A: Secondary | ICD-10-CM | POA: Diagnosis present

## 2016-10-29 DIAGNOSIS — F17210 Nicotine dependence, cigarettes, uncomplicated: Secondary | ICD-10-CM | POA: Diagnosis present

## 2016-10-29 DIAGNOSIS — A419 Sepsis, unspecified organism: Secondary | ICD-10-CM | POA: Diagnosis present

## 2016-10-29 DIAGNOSIS — O0337 Sepsis following incomplete spontaneous abortion: Principal | ICD-10-CM | POA: Diagnosis present

## 2016-10-29 HISTORY — DX: Gonorrhea complicating pregnancy, second trimester: O98.212

## 2016-10-29 HISTORY — DX: Other psychoactive substance abuse, uncomplicated: F19.10

## 2016-10-29 HISTORY — DX: Schizophrenia, unspecified: F20.9

## 2016-10-29 LAB — CBC
HCT: 27.9 % — ABNORMAL LOW (ref 36.0–46.0)
HEMATOCRIT: 32.3 % — AB (ref 36.0–46.0)
Hemoglobin: 11.1 g/dL — ABNORMAL LOW (ref 12.0–15.0)
Hemoglobin: 9.7 g/dL — ABNORMAL LOW (ref 12.0–15.0)
MCH: 31.6 pg (ref 26.0–34.0)
MCH: 32.3 pg (ref 26.0–34.0)
MCHC: 34.4 g/dL (ref 30.0–36.0)
MCHC: 34.8 g/dL (ref 30.0–36.0)
MCV: 92 fL (ref 78.0–100.0)
MCV: 93 fL (ref 78.0–100.0)
PLATELETS: 215 10*3/uL (ref 150–400)
Platelets: 246 10*3/uL (ref 150–400)
RBC: 3.51 MIL/uL — ABNORMAL LOW (ref 3.87–5.11)
RBC: 3 MIL/uL — AB (ref 3.87–5.11)
RDW: 14.4 % (ref 11.5–15.5)
RDW: 14.5 % (ref 11.5–15.5)
WBC: 23.6 10*3/uL — AB (ref 4.0–10.5)
WBC: 19.3 10*3/uL — AB (ref 4.0–10.5)

## 2016-10-29 LAB — BASIC METABOLIC PANEL
Anion gap: 9 (ref 5–15)
BUN: <5 mg/dL — ABNORMAL LOW (ref 6–20)
CALCIUM: 8.5 mg/dL — AB (ref 8.9–10.3)
CO2: 21 mmol/L — ABNORMAL LOW (ref 22–32)
Chloride: 99 mmol/L — ABNORMAL LOW (ref 101–111)
Creatinine, Ser: 0.68 mg/dL (ref 0.44–1.00)
GFR calc Af Amer: >60 mL/min (ref >60)
GFR calc non Af Amer: >60 mL/min (ref >60)
GLUCOSE: 114 mg/dL — AB (ref 65–99)
Potassium: 3.3 mmol/L — ABNORMAL LOW (ref 3.5–5.1)
Sodium: 129 mmol/L — ABNORMAL LOW (ref 135–145)

## 2016-10-29 LAB — URINALYSIS, ROUTINE W REFLEX MICROSCOPIC
Glucose, UA: 100 mg/dL — AB
Ketones, ur: NEGATIVE mg/dL
Nitrite: NEGATIVE
Specific Gravity, Urine: 1.02 (ref 1.005–1.030)
pH: 7.5 (ref 5.0–8.0)

## 2016-10-29 LAB — RAPID URINE DRUG SCREEN, HOSP PERFORMED
Amphetamines: NOT DETECTED
Barbiturates: NOT DETECTED
Benzodiazepines: NOT DETECTED
Cocaine: POSITIVE — AB
Opiates: NOT DETECTED
Tetrahydrocannabinol: NOT DETECTED

## 2016-10-29 LAB — URINALYSIS, MICROSCOPIC (REFLEX)

## 2016-10-29 LAB — I-STAT CG4 LACTIC ACID, ED: LACTIC ACID, VENOUS: 2.22 mmol/L — AB (ref 0.5–1.9)

## 2016-10-29 LAB — I-STAT BETA HCG BLOOD, ED (MC, WL, AP ONLY): I-stat hCG, quantitative: >2000 m[IU]/mL — ABNORMAL HIGH (ref <5)

## 2016-10-29 LAB — LACTIC ACID, PLASMA: LACTIC ACID, VENOUS: 2.7 mmol/L — AB (ref 0.5–1.9)

## 2016-10-29 LAB — HCG, QUANTITATIVE, PREGNANCY: HCG, BETA CHAIN, QUANT, S: 16786 m[IU]/mL — AB (ref <5)

## 2016-10-29 MED ORDER — OXYTOCIN 10 UNIT/ML IJ SOLN
20.0000 [IU] | Freq: Once | INTRAMUSCULAR | Status: AC
  Administered 2016-10-29: 20 [IU] via INTRAMUSCULAR
  Filled 2016-10-29: qty 2

## 2016-10-29 MED ORDER — DIBUCAINE 1 % RE OINT: 1.0000 "application " | TOPICAL_OINTMENT | RECTAL | Status: DC | PRN

## 2016-10-29 MED ORDER — ONDANSETRON HCL 4 MG/2ML IJ SOLN: 4.0000 mg | INTRAMUSCULAR | Status: DC | PRN

## 2016-10-29 MED ORDER — MISOPROSTOL 200 MCG PO TABS
600.0000 ug | ORAL_TABLET | Freq: Once | ORAL | Status: AC
  Administered 2016-10-29: 600 ug via RECTAL
  Filled 2016-10-29: qty 3

## 2016-10-29 MED ORDER — SODIUM CHLORIDE 0.9 % IV BOLUS (SEPSIS)
1000.0000 mL | Freq: Once | INTRAVENOUS | Status: AC
  Administered 2016-10-29: 1000 mL via INTRAVENOUS

## 2016-10-29 MED ORDER — DEXAMETHASONE SODIUM PHOSPHATE 10 MG/ML IJ SOLN
INTRAMUSCULAR | Status: AC
  Filled 2016-10-29: qty 1

## 2016-10-29 MED ORDER — TETANUS-DIPHTH-ACELL PERTUSSIS 5-2.5-18.5 LF-MCG/0.5 IM SUSP: 0.5000 mL | Freq: Once | INTRAMUSCULAR | Status: DC

## 2016-10-29 MED ORDER — PRENATAL MULTIVITAMIN CH: 1.0000 | ORAL_TABLET | Freq: Every day | ORAL | Status: DC

## 2016-10-29 MED ORDER — METRONIDAZOLE IN NACL 5-0.79 MG/ML-% IV SOLN
500.0000 mg | Freq: Three times a day (TID) | INTRAVENOUS | Status: DC
  Administered 2016-10-29 – 2016-10-30 (×2): 500 mg via INTRAVENOUS
  Filled 2016-10-29 (×3): qty 100

## 2016-10-29 MED ORDER — LORAZEPAM 2 MG/ML IJ SOLN
1.0000 mg | Freq: Once | INTRAMUSCULAR | Status: AC
  Administered 2016-10-29: 1 mg via INTRAVENOUS
  Filled 2016-10-29: qty 1

## 2016-10-29 MED ORDER — SENNOSIDES-DOCUSATE SODIUM 8.6-50 MG PO TABS: 2.0000 | ORAL_TABLET | ORAL | Status: DC

## 2016-10-29 MED ORDER — SIMETHICONE 80 MG PO CHEW: 80.0000 mg | CHEWABLE_TABLET | ORAL | Status: DC | PRN

## 2016-10-29 MED ORDER — ZOLPIDEM TARTRATE 5 MG PO TABS: 5.0000 mg | ORAL_TABLET | Freq: Every evening | ORAL | Status: DC | PRN

## 2016-10-29 MED ORDER — CIPROFLOXACIN IN D5W 400 MG/200ML IV SOLN
400.0000 mg | Freq: Two times a day (BID) | INTRAVENOUS | Status: DC
  Administered 2016-10-29 – 2016-10-30 (×2): 400 mg via INTRAVENOUS
  Filled 2016-10-29 (×2): qty 200

## 2016-10-29 MED ORDER — ONDANSETRON HCL 4 MG PO TABS: 4.0000 mg | ORAL_TABLET | ORAL | Status: DC | PRN

## 2016-10-29 MED ORDER — COCONUT OIL OIL: 1.0000 "application " | TOPICAL_OIL | Status: DC | PRN

## 2016-10-29 MED ORDER — PROPOFOL 10 MG/ML IV BOLUS
INTRAVENOUS | Status: AC
  Filled 2016-10-29: qty 20

## 2016-10-29 MED ORDER — IBUPROFEN 600 MG PO TABS
600.0000 mg | ORAL_TABLET | Freq: Four times a day (QID) | ORAL | Status: DC
  Administered 2016-10-29: 600 mg via ORAL

## 2016-10-29 MED ORDER — ONDANSETRON HCL 4 MG/2ML IJ SOLN
INTRAMUSCULAR | Status: AC
  Filled 2016-10-29: qty 2

## 2016-10-29 MED ORDER — WITCH HAZEL-GLYCERIN EX PADS: 1.0000 "application " | MEDICATED_PAD | CUTANEOUS | Status: DC | PRN

## 2016-10-29 MED ORDER — LACTATED RINGERS IV BOLUS (SEPSIS)
1000.0000 mL | Freq: Once | INTRAVENOUS | Status: DC
  Administered 2016-10-29: 1000 mL via INTRAVENOUS

## 2016-10-29 MED ORDER — BENZOCAINE-MENTHOL 20-0.5 % EX AERO: 1.0000 "application " | INHALATION_SPRAY | CUTANEOUS | Status: DC | PRN

## 2016-10-29 MED ORDER — DIPHENHYDRAMINE HCL 25 MG PO CAPS: 25.0000 mg | ORAL_CAPSULE | Freq: Four times a day (QID) | ORAL | Status: DC | PRN

## 2016-10-29 MED ORDER — ACETAMINOPHEN 325 MG PO TABS: 650.0000 mg | ORAL_TABLET | ORAL | Status: DC | PRN

## 2016-10-29 MED ORDER — LIDOCAINE HCL (CARDIAC) 20 MG/ML IV SOLN
INTRAVENOUS | Status: AC
  Filled 2016-10-29: qty 5

## 2016-10-29 MED ORDER — SUCCINYLCHOLINE CHLORIDE 200 MG/10ML IV SOSY
PREFILLED_SYRINGE | INTRAVENOUS | Status: AC
  Filled 2016-10-29: qty 10

## 2016-10-29 MED ORDER — MORPHINE SULFATE (PF) 4 MG/ML IV SOLN
4.0000 mg | Freq: Once | INTRAVENOUS | Status: AC
  Administered 2016-10-29: 4 mg via INTRAVENOUS
  Filled 2016-10-29: qty 1

## 2016-10-29 MED ORDER — ONDANSETRON HCL 4 MG/2ML IJ SOLN
4.0000 mg | Freq: Once | INTRAMUSCULAR | Status: AC
  Administered 2016-10-29: 4 mg via INTRAVENOUS
  Filled 2016-10-29: qty 2

## 2016-10-29 NOTE — Progress Notes (Signed)
CRITICAL VALUE ALERT  Critical Value: Lactic acid 2.7  Date & Time Notied: 2052  Provider Notified: Dr Ephriam Knuckleshristian  Orders Received/Actions taken : no further orders
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
## 2016-10-29 NOTE — Progress Notes (Signed)
Patient ID: Sandra ComingsJessica V Price, female   DOB: September 08, 1985, 31 y.o.   MRN: [id number] Pt remains uncooperative. Falls asleep easily. Arouses easily. Attempted to remove placenta with gentle traction. Cord snapped off. Exam placenta feels to be at cervical os. Pt attempted to push but was uncooperative. Min bleeding noted. Will reevaluate later.

## 2016-10-29 NOTE — ED Triage Notes (Signed)
Per Pt, PT is coming from home with complaints of vaginal bleeding that started yesterday., Pt reports being approximately [redacted] weeks pregnant. Pt reports lower abdominal cramping that started with bleeding.

## 2016-10-29 NOTE — ED Provider Notes (Signed)
[facility]-EMERGENCY DEPT Provider Note   CSN: [id number] Arrival date & time: 10/29/16  1131     History   Chief Complaint Chief Complaint  Patient presents with  . Vaginal Bleeding    HPI [name] is a 31 y.o. female.  HPI   A LEVEL 5 CAVEAT PERTAINS DUE TO POOR HISTORIAN/ALTERED MENTAL STATUS. Pt presenting due to vaginal bleeding.  She does not participate much in the history and will only sporadically answer questions.  Family member who is with her reports she started having vaginal bleeding last night.  Pt c/o lower abdominal pain.  She states she took a home pregnancy test and thinks she is "3-4" months pregnant.  Pt will not answer when her last menses was.  She will not answer further questions.  She is screaming "get it out of me".    Past Medical History:  Diagnosis Date  . Asthma   . Current smoker   . Polysubstance abuse   . Schizophrenia [facility](HCC)     Patient Active Problem List   Diagnosis Date Noted  . Status post preterm vaginal delivery 01/02/2014  . Preterm labor in second trimester with preterm delivery in second trimester 01/01/2014  . Short cervix in second trimester, antepartum 11/29/2013  . Gonorrhea affecting pregnancy in second trimester, antepartum 11/07/2013  . Illicit drug use, including cocaine 11/07/2013  . Supervision of high-risk pregnancy d/t cocaine use 11/06/2013  . Late prenatal care affecting pregnancy 11/06/2013  . Depression with anxiety 11/06/2013    Past Surgical History:  Procedure Laterality Date  . BREAST ENHANCEMENT SURGERY    . breat implants      OB History    Gravida Para Term Preterm AB Living   3 2 1 1 1 2    SAB TAB Ectopic Multiple Live Births   0 1 0 0 2       Home Medications    Prior to Admission medications   Medication Sig Start Date End Date Taking? Authorizing Provider  ciprofloxacin (CIPRO) 500 MG tablet Take 1 tablet (500 mg total) by mouth 2 (two) times daily. Patient not taking: Reported on  04/06/2016 04/15/14   Donnetta Hutchingook, Brian, MD  HYDROcodone-acetaminophen [facility](NORCO) 5-325 MG per tablet Take 1-2 tablets by mouth every 4 (four) hours as needed. Patient not taking: Reported on 04/06/2016 04/15/14   Donnetta Hutchingook, Brian, MD  ibuprofen (ADVIL,MOTRIN) 600 MG tablet Take 1 tablet (600 mg total) by mouth every 6 (six) hours. Patient not taking: Reported on 04/06/2016 01/03/14   Anyanwu, Jethro BastosUgonna A, MD  Prenatal Multivit-Min-Fe-FA (PRENATAL VITAMINS) 0.8 MG tablet Take 1 tablet by mouth daily. Patient not taking: Reported on 04/06/2016 12/19/13   Jacklyn Shellresenzo-Dishmon, Frances, CNM    Family History No family history on file.  Social History Social History  Substance Use Topics  . Smoking status: Current Some Day Smoker    Packs/day: 0.10    Types: Cigarettes  . Smokeless tobacco: Never Used  . Alcohol use Yes     Allergies   Penicillins   Review of Systems Review of Systems  UNABLE TO OBTAIN ROS DUE TO LEVEL 5 CAVEAT   Physical Exam Updated Vital Signs BP (!) 98/55 (BP Location: Left Leg)   Pulse (!) 108   Temp 99.7 F (37.6 C) (Axillary)   Resp 19   Ht 5\' 9"  (1.753 m)   Wt 59 kg (130 lb)   SpO2 97%   BMI 19.20 kg/m  Vitals reviewed Physical Exam  Physical Examination: General appearance -  alert, screaming and agitated on arrival, intermittent appears to fall asleep Mental status - alert, oriented to person, place, and time Eyes - no conjunctival injection, no scleral icterus Mouth - mucous membranes moist, pharynx normal without lesions Neck - supple, no significant adenopathy Chest - clear to auscultation, no wheezes, rales or rhonchi, symmetric air entry Heart - normal rate, regular rhythm, normal S1, S2, no murmurs, rubs, clicks or gallops Abdomen - soft, diffusely tender to palpation- unable to do complete exam- pt pushing examiners hands away and shouting, nondistended, no masses or organomegaly Pelvic - normal external genitalia, large amount of blood in vaginal vault with many  large clots, several clots cleared and bleeding continues.  Pt unable to tolerate cervical exam Neurological - alert, oriented, normal speech, moving all extremities, pt intermittently somnolent (consistently easily arousable) and then agitated and shouting loudly, flailing arms and legs Extremities - peripheral pulses normal, no pedal edema, no clubbing or cyanosis Skin - normal coloration and turgor, no rashes Psych- pt very agitated and not answering questions, then somnolent and refuses to answer questions.     ED Treatments / Results  Labs (all labs ordered are listed, but only abnormal results are displayed) Labs Reviewed  CBC - Abnormal; Notable for the following:       Result Value   WBC 23.6 (*)    RBC 3.51 (*)    Hemoglobin 11.1 (*)    HCT 32.3 (*)    All other components within normal limits  HCG, QUANTITATIVE, PREGNANCY - Abnormal; Notable for the following:    hCG, Beta Chain, Quant, S 16,10916,786 (*)    All other components within normal limits  URINALYSIS, ROUTINE W REFLEX MICROSCOPIC - Abnormal; Notable for the following:    Color, Urine ORANGE (*)    APPearance CLOUDY (*)    Glucose, UA 100 (*)    Hgb urine dipstick LARGE (*)    Bilirubin Urine SMALL (*)    Protein, ur >300 (*)    Leukocytes, UA MODERATE (*)    All other components within normal limits  BASIC METABOLIC PANEL - Abnormal; Notable for the following:    Sodium 129 (*)    Potassium 3.3 (*)    Chloride 99 (*)    CO2 21 (*)    Glucose, Bld 114 (*)    BUN <5 (*)    Calcium 8.5 (*)    All other components within normal limits  RAPID URINE DRUG SCREEN, HOSP PERFORMED - Abnormal; Notable for the following:    Cocaine POSITIVE (*)    All other components within normal limits  URINALYSIS, MICROSCOPIC (REFLEX) - Abnormal; Notable for the following:    Bacteria, UA RARE (*)    Squamous Epithelial / LPF 6-30 (*)    All other components within normal limits  I-STAT BETA HCG BLOOD, ED ([facility], [facility], [facility] ONLY) -  Abnormal; Notable for the following:    I-stat hCG, quantitative >2,000.0 (*)    All other components within normal limits  I-STAT CG4 LACTIC ACID, ED - Abnormal; Notable for the following:    Lactic Acid, Venous 2.22 (*)    All other components within normal limits  CULTURE, BLOOD (ROUTINE X 2)  CULTURE, BLOOD (ROUTINE X 2)  URINE CULTURE  WET PREP, GENITAL  RPR  HIV ANTIBODY (ROUTINE TESTING)  CBC  I-STAT CG4 LACTIC ACID, ED  GC/CHLAMYDIA PROBE AMP ([place]) [facility]    EKG  EKG Interpretation None  Radiology US Ob Limited  Result Date: 10/29/2016 CLINICAL DATA:  Vaginal bleeding during pregnancy. EXAM: LIMITED OBSTETRIC ULTRASOUND COMPARISON:  CT 04/15/2014. FINDINGS: Number of Fetuses: 1 Heart Rate:  153 bpm Movement: Present Presentation: Cephalic Placental Location: Fundal Previa: No Amniotic Fluid (Subjective):  Within normal limits. Femur length:  2.6cm 17w  6d MATERNAL FINDINGS: Cervix:  Appears closed. Uterus/Adnexae: Cervix appears open. Fetal head appears lodged in the open cervix. IMPRESSION: Single viable injury pregnancy at 17 weeks 6 days. Fetal head appears lodged in an open cervix. Emergency gynecologic consultation suggested. This exam is performed on an emergent basis and does not comprehensively evaluate fetal size, dating, or anatomy; follow-up complete OB US should be considered if further fetal assessment is warranted. Electronically Signed   By: Maisie Fus  Register   On: 10/29/2016 15:37    Procedures Procedures (including critical care time)  Medications Ordered in ED Medications  lactated ringers bolus 1,000 mL (not administered)  morphine 4 MG/ML injection 4 mg (4 mg Intravenous Given 10/29/16 1326)  ondansetron (ZOFRAN) injection 4 mg (4 mg Intravenous Given 10/29/16 1326)  sodium chloride 0.9 % bolus 1,000 mL (0 mLs Intravenous Stopped 10/29/16 1418)  sodium chloride 0.9 % bolus 1,000 mL (0 mLs Intravenous Stopped 10/29/16 1547)  LORazepam  (ATIVAN) injection 1 mg (1 mg Intravenous Given 10/29/16 1506)    CRITICAL CARE Performed by: Phineas Real, MARTHA L Total critical care time: 60  minutes Critical care time was exclusive of separately billable procedures and treating other patients. Critical care was necessary to treat or prevent imminent or life-threatening deterioration. Critical care was time spent personally by me on the following activities: development of treatment plan with patient and/or surrogate as well as nursing, discussions with consultants, evaluation of patient's response to treatment, examination of patient, obtaining history from patient or surrogate, ordering and performing treatments and interventions, ordering and review of laboratory studies, ordering and review of radiographic studies, pulse oximetry and re-evaluation of patient's condition.  Initial Impression / Assessment and Plan / ED Course  I have reviewed the triage vital signs and the nursing notes.  Pertinent labs & imaging results that were available during my care of the patient were reviewed by me and considered in my medical decision making (see chart for details).    2:52 PM pt is going for ultrasound now.    3:30 PM call from US shows 17 week fetus with head coming through cervix.  On pelvic exam patient has clots that were evacuated from vault, however ongoing bleeding and not able to clear clots.  Called OB number [phone] wiithout response.  Called OB rapid response number and spoke with nurse- she will try to locate on call physician.    3:35 PM d/w Dr. Alysia Penna, [facility]- he recommends transfer to [facility].  Her vitals remain stable, she is more somnoelnt after receiving ativan due to her anxiety/screaming during ultrasound.  She is protecting her airway and easily arousable.    4:44 PM [facility] is taking patient now.  She remains easily arousable, but somnolent intermittently and anxious and screaming/flailing intermittently.  UDS has returned  positive for cocaine. Her vital signs remain stable, IV fluids running.      Final Clinical Impressions(s) / ED Diagnoses   Final diagnoses:  Incomplete miscarriage  Vaginal bleeding    New Prescriptions Current Discharge Medication List       Phillis Haggis, MD 10/29/16 1719

## 2016-10-29 NOTE — ED Notes (Signed)
Contacted [facility] to tx patient to MAU; Dr. Alysia PennaErvin receiving

## 2016-10-29 NOTE — H&P (Signed)
Johnell ComingsJessica V Savitz is a [age] y.73o.G3P112  female with unknown gestation, [facility] with impending delivery of previable. She delivered a non viable infant in the MAU. However placenta had not delivered. See MAU note for additional information  OB History    Gravida Para Term Preterm AB Living   4 2 1 1 1 2    SAB TAB Ectopic Multiple Live Births   0 1 0 0 2     Past Medical History:  Diagnosis Date  . Asthma   . Current smoker   . Polysubstance abuse   . Schizophrenia [facility](HCC)    Past Surgical History:  Procedure Laterality Date  . BREAST ENHANCEMENT SURGERY    . breat implants     Family History: family history is not on file. Social History:  reports that she has been smoking Cigarettes.  She has been smoking about 0.10 packs per day. She has never used smokeless tobacco. She reports that she drinks alcohol. She reports that she does not use drugs.      Review of Systems  Unable to perform ROS: Other   History   Blood pressure 123/64, pulse 100, temperature 100.3 F (37.9 C), resp. rate 19, height 5\' 9"  (1.753 m), weight 59 kg (130 lb), SpO2 97 %, unknown if currently breastfeeding. Exam Physical Exam  Cardiovascular: Normal rate and regular rhythm.   Respiratory: Effort normal and breath sounds normal.  GI: Soft. Bowel sounds are normal.      Assessment/Plan: S/P SVD of non viable infant H/O drug abuse  Will admit for observation. Medications given in MAU which hopefully will fucilate delivery of the placenta. Pt unable to provide consent for D & C at this time. Lactic acid elevated as is WBC, do not think septic but will begin Zosyn.     Hermina StaggersMichael L Omarian Jaquith 10/29/2016, 7:41 PM

## 2016-10-29 NOTE — MAU Note (Signed)
Pt arrived via [facility]. Large amount of blood noted on blue chucks pad. Pt combative, but responds to commands. Fetal parts with cord noted in vagina. J Rasch NP at bedside.

## 2016-10-29 NOTE — ED Notes (Signed)
Pt informed needed urine multiple times unable to provide and refusing cath. Linker MD made aware. Pt to US.

## 2016-10-29 NOTE — MAU Provider Note (Signed)
History     CSN: [id number]  Arrival date and time: 10/29/16 1131   None     Chief Complaint  Patient presents with  . Vaginal Bleeding   HPI   [name] is a 31 y.o. female [id number] at unknown gestation here from [facility] ED with an impending delivery of a preterm, previable fetus. [place] measured fetus at approximately [redacted] weeks gestation. Patient not participating in course of care here in MAU. She does not seem to understand the diagnoses. At times she reaches for her vagina, and sporadically thrashes in the bed.   OB History    Gravida Para Term Preterm AB Living   3 2 1 1 1 2    SAB TAB Ectopic Multiple Live Births   0 1 0 0 2      Past Medical History:  Diagnosis Date  . Asthma   . Current smoker   . Polysubstance abuse   . Schizophrenia [facility])     Past Surgical History:  Procedure Laterality Date  . BREAST ENHANCEMENT SURGERY    . breat implants      No family history on file.  Social History  Substance Use Topics  . Smoking status: Current Some Day Smoker    Packs/day: 0.10    Types: Cigarettes  . Smokeless tobacco: Never Used  . Alcohol use Yes    Allergies:  Allergies  Allergen Reactions  . Penicillins Hives    Prescriptions Prior to Admission  Medication Sig Dispense Refill Last Dose  . ciprofloxacin (CIPRO) 500 MG tablet Take 1 tablet (500 mg total) by mouth 2 (two) times daily. (Patient not taking: Reported on 04/06/2016) 20 tablet 0 Completed Course at Unknown time  . HYDROcodone-acetaminophen (NORCO) 5-325 MG per tablet Take 1-2 tablets by mouth every 4 (four) hours as needed. (Patient not taking: Reported on 04/06/2016) 15 tablet 0 Not Taking at Unknown time  . ibuprofen (ADVIL,MOTRIN) 600 MG tablet Take 1 tablet (600 mg total) by mouth every 6 (six) hours. (Patient not taking: Reported on 04/06/2016) 60 tablet 2 Not Taking at Unknown time  . Prenatal Multivit-Min-Fe-FA (PRENATAL VITAMINS) 0.8 MG tablet Take 1 tablet by mouth daily.  (Patient not taking: Reported on 04/06/2016) 30 tablet 12 Not Taking at Unknown time   Results for orders placed or performed during the hospital encounter of 10/29/16 (from the past 48 hour(s))  CBC     Status: Abnormal   Collection Time: 10/29/16  1:18 PM  Result Value Ref Range   WBC 23.6 (H) 4.0 - 10.5 K/uL   RBC 3.51 (L) 3.87 - 5.11 MIL/uL   Hemoglobin 11.1 (L) 12.0 - 15.0 g/dL   HCT 32.3 (L) 36.0 - 46.0 %   MCV 92.0 78.0 - 100.0 fL   MCH 31.6 26.0 - 34.0 pg   MCHC 34.4 30.0 - 36.0 g/dL   RDW 14.4 11.5 - 15.5 %   Platelets 246 150 - 400 K/uL  hCG, quantitative, pregnancy     Status: Abnormal   Collection Time: 10/29/16  1:18 PM  Result Value Ref Range   hCG, Beta Chain, Quant, S 16,786 (H) <5 mIU/mL    Comment:          GEST. AGE      CONC.  (mIU/mL)   <=1 WEEK        5 - 50     2 WEEKS       50 - 500     3 WEEKS  100 - 10,000     4 WEEKS     1,000 - 30,000     5 WEEKS     3,500 - 115,000   6-8 WEEKS     12,000 - 270,000    12 WEEKS     15,000 - 220,000        FEMALE AND NON-PREGNANT FEMALE:     LESS THAN 5 mIU/mL   Basic metabolic panel     Status: Abnormal   Collection Time: 10/29/16  1:18 PM  Result Value Ref Range   Sodium 129 (L) 135 - 145 mmol/L   Potassium 3.3 (L) 3.5 - 5.1 mmol/L   Chloride 99 (L) 101 - 111 mmol/L   CO2 21 (L) 22 - 32 mmol/L   Glucose, Bld 114 (H) 65 - 99 mg/dL   BUN <5 (L) 6 - 20 mg/dL   Creatinine, Ser 0.68 0.44 - 1.00 mg/dL   Calcium 8.5 (L) 8.9 - 10.3 mg/dL   GFR calc non Af Amer >60 >60 mL/min   GFR calc Af Amer >60 >60 mL/min    Comment: (NOTE) The eGFR has been calculated using the CKD EPI equation. This calculation has not been validated in all clinical situations. eGFR's persistently <60 mL/min signify possible Chronic Kidney Disease.    Anion gap 9 5 - 15  I-Stat Beta hCG blood, ED ([facility], [facility], [facility] only)     Status: Abnormal   Collection Time: 10/29/16  1:27 PM  Result Value Ref Range   I-stat hCG, quantitative >2,000.0 (H) <5  mIU/mL   Comment 3            Comment:   GEST. AGE      CONC.  (mIU/mL)   <=1 WEEK        5 - 50     2 WEEKS       50 - 500     3 WEEKS       100 - 10,000     4 WEEKS     1,000 - 30,000        FEMALE AND NON-PREGNANT FEMALE:     LESS THAN 5 mIU/mL   I-Stat CG4 Lactic Acid, ED     Status: Abnormal   Collection Time: 10/29/16  2:31 PM  Result Value Ref Range   Lactic Acid, Venous 2.22 (HH) 0.5 - 1.9 mmol/L   Comment NOTIFIED PHYSICIAN   Urinalysis, Routine w reflex microscopic     Status: Abnormal   Collection Time: 10/29/16  3:37 PM  Result Value Ref Range   Color, Urine ORANGE (A) YELLOW    Comment: BIOCHEMICALS MAY BE AFFECTED BY COLOR   APPearance CLOUDY (A) CLEAR   Specific Gravity, Urine 1.020 1.005 - 1.030   pH 7.5 5.0 - 8.0   Glucose, UA 100 (A) NEGATIVE mg/dL   Hgb urine dipstick LARGE (A) NEGATIVE   Bilirubin Urine SMALL (A) NEGATIVE   Ketones, ur NEGATIVE NEGATIVE mg/dL   Protein, ur >300 (A) NEGATIVE mg/dL   Nitrite NEGATIVE NEGATIVE   Leukocytes, UA MODERATE (A) NEGATIVE  Rapid urine drug screen (hospital performed)     Status: Abnormal   Collection Time: 10/29/16  3:37 PM  Result Value Ref Range   Opiates NONE DETECTED NONE DETECTED   Cocaine POSITIVE (A) NONE DETECTED   Benzodiazepines NONE DETECTED NONE DETECTED   Amphetamines NONE DETECTED NONE DETECTED   Tetrahydrocannabinol NONE DETECTED NONE DETECTED   Barbiturates NONE DETECTED NONE DETECTED  Comment:        DRUG SCREEN FOR MEDICAL PURPOSES ONLY.  IF CONFIRMATION IS NEEDED FOR ANY PURPOSE, NOTIFY LAB WITHIN 5 DAYS.        LOWEST DETECTABLE LIMITS FOR URINE DRUG SCREEN Drug Class       Cutoff (ng/mL) Amphetamine      1000 Barbiturate      200 Benzodiazepine   505 Tricyclics       397 Opiates          300 Cocaine          300 THC              50   Urinalysis, Microscopic (reflex)     Status: Abnormal   Collection Time: 10/29/16  3:37 PM  Result Value Ref Range   RBC / HPF TOO NUMEROUS TO  COUNT 0 - 5 RBC/hpf   WBC, UA 6-30 0 - 5 WBC/hpf   Bacteria, UA RARE (A) NONE SEEN   Squamous Epithelial / LPF 6-30 (A) NONE SEEN  CBC     Status: Abnormal   Collection Time: 10/29/16  5:29 PM  Result Value Ref Range   WBC 19.3 (H) 4.0 - 10.5 K/uL   RBC 3.00 (L) 3.87 - 5.11 MIL/uL   Hemoglobin 9.7 (L) 12.0 - 15.0 g/dL   HCT 27.9 (L) 36.0 - 46.0 %   MCV 93.0 78.0 - 100.0 fL   MCH 32.3 26.0 - 34.0 pg   MCHC 34.8 30.0 - 36.0 g/dL   RDW 14.5 11.5 - 15.5 %   Platelets 215 150 - 400 K/uL   US Ob Limited  Result Date: 10/29/2016 CLINICAL DATA:  Vaginal bleeding during pregnancy. EXAM: LIMITED OBSTETRIC ULTRASOUND COMPARISON:  CT 04/15/2014. FINDINGS: Number of Fetuses: 1 Heart Rate:  153 bpm Movement: Present Presentation: Cephalic Placental Location: Fundal Previa: No Amniotic Fluid (Subjective):  Within normal limits. Femur length:  2.6cm 17w  6d MATERNAL FINDINGS: Cervix:  Appears closed. Uterus/Adnexae: Cervix appears open. Fetal head appears lodged in the open cervix. IMPRESSION: Single viable injury pregnancy at 17 weeks 6 days. Fetal head appears lodged in an open cervix. Emergency gynecologic consultation suggested. This exam is performed on an emergent basis and does not comprehensively evaluate fetal size, dating, or anatomy; follow-up complete OB US should be considered if further fetal assessment is warranted. Electronically Signed   By: Marcello Moores  Register   On: 10/29/2016 15:37   Review of Systems  Unable to perform ROS: Patient nonverbal   Physical Exam   Blood pressure (!) 98/55, pulse (!) 108, temperature 99.7 F (37.6 C), temperature source Axillary, resp. rate 19, height 5' 9"  (1.753 m), weight 130 lb (59 kg), SpO2 97 %, unknown if currently breastfeeding. Patient Vitals for the past 24 hrs:  BP Temp Temp src Pulse Resp SpO2 Height Weight  10/29/16 1745 119/63 - - (!) 103 - - - -  10/29/16 1717 - - - (!) 108 - 97 % - -  10/29/16 1712 - - - - - 96 % - -  10/29/16 1709 - - -  (!) 103 - - - -  10/29/16 1708 (!) 98/55 99.7 F (37.6 C) Axillary (!) 136 19 - - -  10/29/16 1630 117/60 97.9 F (36.6 C) Axillary 98 16 99 % - -  10/29/16 1615 128/73 - - (!) 104 16 100 % - -  10/29/16 1600 115/67 - - 98 - 100 % - -  10/29/16 1545 115/73 - - 96  16 99 % - -  10/29/16 1534 117/73 - - 97 16 100 % - -  10/29/16 1430 94/71 - - 72 - 100 % - -  10/29/16 1415 116/67 - - 79 16 99 % - -  10/29/16 1400 123/77 - - 77 - 100 % - -  10/29/16 1352 112/71 - - 79 14 100 % - -  10/29/16 1330 112/71 - - 74 16 99 % - -  10/29/16 1315 96/62 - - 88 - 100 % - -  10/29/16 1300 (!) 106/55 - - (!) 58 - 100 % - -  10/29/16 1230 (!) 98/55 - - 62 - 100 % - -  10/29/16 1221 108/62 - - 70 (!) 22 100 % - -  10/29/16 1135 104/63 98 F (36.7 C) Oral 64 (!) 21 100 % 5' 9"  (1.753 m) 130 lb (59 kg)   Physical Exam  Constitutional: She appears lethargic. She is uncooperative. She appears ill.  HENT:  Head: Normocephalic.  Eyes: Pupils are equal, round, and reactive to light.  Respiratory: Effort normal.  GI: Soft.  Genitourinary:  Genitourinary Comments: Cord noted in the vagina on arrival Gentle bimanual exam: fetal parts palpated in the vagina.  Moderate- large amount of active bleeding noted.   Neurological: She appears lethargic.  Skin: Skin is warm.  Psychiatric: She is agitated and withdrawn. Cognition and memory are impaired. She is noncommunicative.   MAU Course  Procedures  None  MDM  O positive blood type  CBC repeat Spontaneous delivery of non viable fetus while patient in the bed. Dr. Elly Modena at bedside.  Manual extraction of placenta attempted by Dr. Elly Modena; placenta remains intact.  20 mg IM pitocin given 600 mcg cyotec per rectum.   Assessment and Plan    A:  1. Incomplete miscarriage   2. Vaginal bleeding during pregnancy   3. Vaginal bleeding     P:  Admit to the 3rd floor. Dr. Rip Harbour to resume care of the patient.  Patient stable for transfer.   Lezlie Lye, NP 10/29/2016 6:46 PM

## 2016-10-30 ENCOUNTER — Encounter (HOSPITAL_COMMUNITY): Payer: Self-pay | Admitting: Anesthesiology

## 2016-10-30 ENCOUNTER — Inpatient Hospital Stay (HOSPITAL_COMMUNITY): Payer: Self-pay

## 2016-10-30 ENCOUNTER — Encounter (HOSPITAL_COMMUNITY)
Admission: AD | Discharge status: Against Medical Advice | Payer: Self-pay | Source: Ambulatory Visit | Attending: Obstetrics and Gynecology

## 2016-10-30 ENCOUNTER — Observation Stay (HOSPITAL_COMMUNITY): Payer: Self-pay | Admitting: Anesthesiology

## 2016-10-30 ENCOUNTER — Telehealth: Payer: Self-pay | Admitting: Obstetrics and Gynecology

## 2016-10-30 DIAGNOSIS — O0932 Supervision of pregnancy with insufficient antenatal care, second trimester: Secondary | ICD-10-CM

## 2016-10-30 DIAGNOSIS — A400 Sepsis due to streptococcus, group A: Secondary | ICD-10-CM | POA: Diagnosis present

## 2016-10-30 DIAGNOSIS — O039 Complete or unspecified spontaneous abortion without complication: Secondary | ICD-10-CM | POA: Diagnosis present

## 2016-10-30 HISTORY — PX: DILATION AND CURETTAGE OF UTERUS: SHX78

## 2016-10-30 LAB — BLOOD CULTURE ID PANEL (REFLEXED)
ACINETOBACTER BAUMANNII: NOT DETECTED
CANDIDA PARAPSILOSIS: NOT DETECTED
CANDIDA TROPICALIS: NOT DETECTED
Candida albicans: NOT DETECTED
Candida glabrata: NOT DETECTED
Candida krusei: NOT DETECTED
ENTEROBACTERIACEAE SPECIES: NOT DETECTED
Enterobacter cloacae complex: NOT DETECTED
Enterococcus species: NOT DETECTED
Escherichia coli: NOT DETECTED
HAEMOPHILUS INFLUENZAE: NOT DETECTED
KLEBSIELLA OXYTOCA: NOT DETECTED
KLEBSIELLA PNEUMONIAE: NOT DETECTED
Listeria monocytogenes: NOT DETECTED
NEISSERIA MENINGITIDIS: NOT DETECTED
PROTEUS SPECIES: NOT DETECTED
Pseudomonas aeruginosa: NOT DETECTED
SERRATIA MARCESCENS: NOT DETECTED
STAPHYLOCOCCUS AUREUS BCID: NOT DETECTED
STAPHYLOCOCCUS SPECIES: NOT DETECTED
STREPTOCOCCUS AGALACTIAE: NOT DETECTED
STREPTOCOCCUS SPECIES: DETECTED — AB
Streptococcus pneumoniae: NOT DETECTED
Streptococcus pyogenes: DETECTED — AB

## 2016-10-30 LAB — CBC WITH DIFFERENTIAL/PLATELET
BASOS PCT: 0 %
Basophils Absolute: 0 10*3/uL (ref 0.0–0.1)
EOS ABS: 0 10*3/uL (ref 0.0–0.7)
Eosinophils Relative: 0 %
HCT: 20.3 % — ABNORMAL LOW (ref 36.0–46.0)
HEMOGLOBIN: 7 g/dL — AB (ref 12.0–15.0)
Lymphocytes Relative: 9 %
Lymphs Abs: 1.8 10*3/uL (ref 0.7–4.0)
MCH: 32.1 pg (ref 26.0–34.0)
MCHC: 34.5 g/dL (ref 30.0–36.0)
MCV: 93.1 fL (ref 78.0–100.0)
MONOS PCT: 5 %
Monocytes Absolute: 1 10*3/uL (ref 0.1–1.0)
NEUTROS PCT: 85 %
Neutro Abs: 16.2 10*3/uL — ABNORMAL HIGH (ref 1.7–7.7)
PLATELETS: 186 10*3/uL (ref 150–400)
RBC: 2.18 MIL/uL — ABNORMAL LOW (ref 3.87–5.11)
RDW: 13.9 % (ref 11.5–15.5)
WBC: 19 10*3/uL — AB (ref 4.0–10.5)

## 2016-10-30 LAB — RPR: RPR: NONREACTIVE

## 2016-10-30 LAB — CBC
HCT: 18.4 % — ABNORMAL LOW (ref 36.0–46.0)
Hemoglobin: 6.4 g/dL — CL (ref 12.0–15.0)
MCH: 31.8 pg (ref 26.0–34.0)
MCHC: 34.8 g/dL (ref 30.0–36.0)
MCV: 91.5 fL (ref 78.0–100.0)
PLATELETS: 186 10*3/uL (ref 150–400)
RBC: 2.01 MIL/uL — AB (ref 3.87–5.11)
RDW: 14.5 % (ref 11.5–15.5)
WBC: 18.3 10*3/uL — AB (ref 4.0–10.5)

## 2016-10-30 LAB — PROTIME-INR
INR: 1.29
PROTHROMBIN TIME: 16.2 s — AB (ref 11.4–15.2)

## 2016-10-30 LAB — URINE CULTURE

## 2016-10-30 LAB — PREPARE RBC (CROSSMATCH)

## 2016-10-30 LAB — PROCALCITONIN: PROCALCITONIN: 0.22 ng/mL

## 2016-10-30 LAB — LACTIC ACID, PLASMA: Lactic Acid, Venous: 1.5 mmol/L (ref 0.5–1.9)

## 2016-10-30 LAB — APTT: aPTT: 30 seconds (ref 24–36)

## 2016-10-30 LAB — HIV ANTIBODY (ROUTINE TESTING W REFLEX): HIV SCREEN 4TH GENERATION: NONREACTIVE

## 2016-10-30 SURGERY — DILATION AND CURETTAGE
Anesthesia: Choice

## 2016-10-30 SURGERY — DILATION AND CURETTAGE
Anesthesia: General

## 2016-10-30 MED ORDER — SUCCINYLCHOLINE CHLORIDE 20 MG/ML IJ SOLN
INTRAMUSCULAR | Status: DC | PRN
  Administered 2016-10-30: 120 mg via INTRAVENOUS

## 2016-10-30 MED ORDER — ONDANSETRON HCL 4 MG/2ML IJ SOLN
INTRAMUSCULAR | Status: DC | PRN
  Administered 2016-10-30: 4 mg via INTRAVENOUS

## 2016-10-30 MED ORDER — PROMETHAZINE HCL 25 MG/ML IJ SOLN: 6.2500 mg | INTRAMUSCULAR | Status: DC | PRN

## 2016-10-30 MED ORDER — CEFAZOLIN SODIUM-DEXTROSE 2-4 GM/100ML-% IV SOLN
2.0000 g | Freq: Three times a day (TID) | INTRAVENOUS | Status: DC
  Administered 2016-10-30: 2 g via INTRAVENOUS
  Filled 2016-10-30 (×2): qty 100

## 2016-10-30 MED ORDER — PROPOFOL 10 MG/ML IV BOLUS
INTRAVENOUS | Status: DC | PRN
  Administered 2016-10-30: 50 mg via INTRAVENOUS
  Administered 2016-10-30: 125 mg via INTRAVENOUS

## 2016-10-30 MED ORDER — LACTATED RINGERS IV SOLN: INTRAVENOUS | Status: DC

## 2016-10-30 MED ORDER — MEPERIDINE HCL 25 MG/ML IJ SOLN: 6.2500 mg | INTRAMUSCULAR | Status: DC | PRN

## 2016-10-30 MED ORDER — FENTANYL CITRATE (PF) 100 MCG/2ML IJ SOLN: 25.0000 ug | INTRAMUSCULAR | Status: DC | PRN

## 2016-10-30 MED ORDER — LIDOCAINE HCL (CARDIAC) 20 MG/ML IV SOLN
INTRAVENOUS | Status: DC | PRN
  Administered 2016-10-30: 60 mg via INTRAVENOUS

## 2016-10-30 MED ORDER — SENNOSIDES-DOCUSATE SODIUM 8.6-50 MG PO TABS: 2.0000 | ORAL_TABLET | Freq: Every evening | ORAL | Status: DC | PRN

## 2016-10-30 MED ORDER — PHENYLEPHRINE 40 MCG/ML (10ML) SYRINGE FOR IV PUSH (FOR BLOOD PRESSURE SUPPORT)
PREFILLED_SYRINGE | INTRAVENOUS | Status: AC
Start: 2016-10-30 — End: 2016-10-30
  Filled 2016-10-30: qty 10

## 2016-10-30 MED ORDER — DEXAMETHASONE SODIUM PHOSPHATE 10 MG/ML IJ SOLN
INTRAMUSCULAR | Status: DC | PRN
  Administered 2016-10-30: 10 mg via INTRAVENOUS

## 2016-10-30 MED ORDER — LACTATED RINGERS IV SOLN
INTRAVENOUS | Status: DC | PRN
  Administered 2016-10-30 (×2): via INTRAVENOUS

## 2016-10-30 MED ORDER — PHENYLEPHRINE HCL 10 MG/ML IJ SOLN
INTRAMUSCULAR | Status: DC | PRN
  Administered 2016-10-30: 80 ug via INTRAVENOUS
  Administered 2016-10-30 (×2): 40 ug via INTRAVENOUS

## 2016-10-30 MED ORDER — SODIUM CHLORIDE 0.9 % IV SOLN
Freq: Once | INTRAVENOUS | Status: AC
  Administered 2016-10-30: 12:00:00 via INTRAVENOUS

## 2016-10-30 MED ORDER — FENTANYL CITRATE (PF) 100 MCG/2ML IJ SOLN
INTRAMUSCULAR | Status: AC
  Filled 2016-10-30: qty 2

## 2016-10-30 SURGICAL SUPPLY — 16 items
CANISTER SUCT 3000ML PPV (MISCELLANEOUS) ×3 IMPLANT
CATH ROBINSON RED A/P 16FR (CATHETERS) ×5 IMPLANT
CLOTH BEACON ORANGE TIMEOUT ST (SAFETY) ×3 IMPLANT
CONTAINER PREFILL 10% NBF 60ML (FORM) ×6 IMPLANT
GLOVE BIO SURGEON STRL SZ7.5 (GLOVE) ×3 IMPLANT
GLOVE BIOGEL PI IND STRL 7.0 (GLOVE) ×1 IMPLANT
GLOVE BIOGEL PI INDICATOR 7.0 (GLOVE) ×2
GOWN STRL REUS W/TWL LRG LVL3 (GOWN DISPOSABLE) ×3 IMPLANT
GOWN STRL REUS W/TWL XL LVL3 (GOWN DISPOSABLE) ×3 IMPLANT
PACK VAGINAL MINOR WOMEN LF (CUSTOM PROCEDURE TRAY) ×3 IMPLANT
PAD OB MATERNITY 4.3X12.25 (PERSONAL CARE ITEMS) ×3 IMPLANT
PAD PREP 24X48 CUFFED NSTRL (MISCELLANEOUS) ×3 IMPLANT
SLEEVE SCD COMPRESS KNEE MED (MISCELLANEOUS) ×2 IMPLANT
TOWEL OR 17X24 6PK STRL BLUE (TOWEL DISPOSABLE) ×6 IMPLANT
TRAY FOLEY BAG SILVER LF 14FR (SET/KITS/TRAYS/PACK) ×2 IMPLANT
WATER STERILE IRR 1000ML POUR (IV SOLUTION) ×3 IMPLANT

## 2016-10-30 NOTE — Anesthesia Preprocedure Evaluation (Addendum)
Anesthesia Evaluation  Patient identified by MRN, date of birth, ID band  Reviewed: Allergy & Precautions, Patient's Chart, lab work & pertinent test results, Unable to perform ROS - Chart review only  Airway Mallampati: II  TM Distance: >3 FB Neck ROM: Full    Dental  (+) Teeth Intact, Dental Advisory Given   Pulmonary asthma , Current Smoker,    breath sounds clear to auscultation       Cardiovascular negative cardio ROS   Rhythm:Regular Rate:Normal     Neuro/Psych PSYCHIATRIC DISORDERS Schizophrenia negative neurological ROS     GI/Hepatic negative GI ROS, Neg liver ROS,   Endo/Other  negative endocrine ROS  Renal/GU negative Renal ROS     Musculoskeletal negative musculoskeletal ROS (+)   Abdominal   Peds  Hematology negative hematology ROS (+)   Anesthesia Other Findings Day of surgery medications reviewed with the patient.  Reproductive/Obstetrics                           Lab Results  Component Value Date   WBC 19.3 (H) 10/29/2016   HGB 9.7 (L) 10/29/2016   HCT 27.9 (L) 10/29/2016   MCV 93.0 10/29/2016   PLT 215 10/29/2016     Anesthesia Physical Anesthesia Plan  ASA: II and emergent  Anesthesia Plan: General   Post-op Pain Management:    Induction: Intravenous  PONV Risk Score and Plan: 3 and Ondansetron, Dexamethasone, Propofol and Midazolam  Airway Management Planned: Oral ETT  Additional Equipment:   Intra-op Plan:   Post-operative Plan: Extubation in OR  Informed Consent: I have reviewed the patients History and Physical, chart, labs and discussed the procedure including the risks, benefits and alternatives for the proposed anesthesia with the patient or authorized representative who has indicated his/her understanding and acceptance.   Dental advisory given  Plan Discussed with: CRNA  Anesthesia Plan Comments: (Pt intoxicated and requiring continuous  encouragement to arouse prior to procedure. Risks of cocaine and GA discussed in detail. Pt unable to remove all peircings. )       Anesthesia Quick Evaluation

## 2016-10-30 NOTE — Anesthesia Postprocedure Evaluation (Signed)
Anesthesia Post Note  Patient: Sandra ComingsJessica V Price  Procedure(s) Performed: Procedure(s) (LRB): DILATATION AND CURETTAGE (N/A)     Patient location during evaluation: PACU Anesthesia Type: General Level of consciousness: sedated Pain management: pain level controlled Vital Signs Assessment: post-procedure vital signs reviewed and stable Respiratory status: spontaneous breathing, nonlabored ventilation, respiratory function stable and patient connected to nasal cannula oxygen Cardiovascular status: blood pressure returned to baseline and stable Postop Assessment: no signs of nausea or vomiting Anesthetic complications: no    Last Vitals:  Vitals:   10/30/16 0200 10/30/16 0215  BP:  (!) 104/59  Pulse: 89 80  Resp: (!) 21 17  Temp:  37.2 C    Last Pain:  Vitals:   10/30/16 0220  TempSrc:   PainSc: Asleep   Pain Goal:                 Shelton SilvasKevin D Genowefa Morga

## 2016-10-30 NOTE — Anesthesia Preprocedure Evaluation (Deleted)
Anesthesia Evaluation  Patient identified by MRN, date of birth, ID band Patient awake    Reviewed: Allergy & Precautions, NPO status , Patient's Chart, lab work & pertinent test results  Airway        Dental   Pulmonary asthma , Current Smoker,           Cardiovascular negative cardio ROS       Neuro/Psych PSYCHIATRIC DISORDERS Schizophrenia    GI/Hepatic negative GI ROS, Neg liver ROS,   Endo/Other  negative endocrine ROS  Renal/GU negative Renal ROS     Musculoskeletal negative musculoskeletal ROS (+)   Abdominal   Peds  Hematology negative hematology ROS (+)   Anesthesia Other Findings   Reproductive/Obstetrics                             Anesthesia Physical Anesthesia Plan  ASA: II and emergent  Anesthesia Plan: General   Post-op Pain Management:    Induction:   PONV Risk Score and Plan: 3 and Ondansetron, Dexamethasone, Propofol and Midazolam  Airway Management Planned: Oral ETT  Additional Equipment:   Intra-op Plan:   Post-operative Plan: Extubation in OR  Informed Consent: I have reviewed the patients History and Physical, chart, labs and discussed the procedure including the risks, benefits and alternatives for the proposed anesthesia with the patient or authorized representative who has indicated his/her understanding and acceptance.   Dental advisory given  Plan Discussed with: CRNA  Anesthesia Plan Comments:         Anesthesia Quick Evaluation

## 2016-10-30 NOTE — Transfer of Care (Signed)
Immediate Anesthesia Transfer of Care Note  Patient: Sandra ComingsJessica V Gancarz  Procedure(s) Performed: Procedure(s): DILATATION AND CURETTAGE (N/A)  Patient Location: PACU  Anesthesia Type:General  Level of Consciousness: sedated and responds to stimulation  Airway & Oxygen Therapy: Patient Spontanous Breathing and Patient connected to nasal cannula oxygen  Post-op Assessment: Report given to RN and Post -op Vital signs reviewed and stable  Post vital signs: Reviewed and stable  Last Vitals:  Bp 112/61 HR 94 Resp 22 O2 sat 100%   Last Pain:  Vitals:   10/29/16 2250  TempSrc:   PainSc: Asleep         Complications: No apparent anesthesia complications

## 2016-10-30 NOTE — Progress Notes (Signed)
CSW attempted again to wake patient to speak with her.  Patient was drowsy, but woke up when NT loudly spoke her name.  Patient reports remembering CSW from her delivery in 2015 and reports that Lennox GrumblesRudy (her son born at 1625 weeks in 2015) is doing well.  "He's with my mom."  CSW asked patient how she is feeling and evaluated whether she has understanding of the recent events.  She replied forcefully, "I didn't know I was pregnant."  She was attempting to call her father while talking with CSW and then said, "can I leave?"  CSW asked for RN to come talk with patient about her POC.  CSW informed patient that she is not being held in the hospital against her will, but there are risks if she is to leave before completing treatment.  Patient states she does not want to talk about the baby or her substance use.  She declines resources for substance abuse treatment.  RN came into the room and informed patient that she will need to stay for 7 days for antibiotics for an infection in her blood stream.  Patient was fixated on how she got an infection.  RN and CSW spoke with patient about the risk of increased illness and or death if she decides to leave without treatment.  She concludes that she wants to leave and will not consider staying.  RN called MD to come talk with patient.

## 2016-10-30 NOTE — Progress Notes (Signed)
Subjective: Pt still very sleepy but more alert this morning. Able to answer questions. Denies pain. Tolerating diet.    Objective: AF VSS Good UOP Lungs clear Heart RRR Abd soft + BS uterus firm GU min bleeding Ext SCD's    Assessment/Plan: PPD # 1 SVD on non viable infant DOS D & C for retained placenta Anemia secondary to above ? Sepsis + Drug use  Pt's last lactic acid normal, VSS and good UOP. Discussed blood transfusion with pt and step father. Agreed to transfusion. Will continue with antibiotics, follow labs after transfusion, d/c foley, ambulate and SW consult. Possible discharge home later today or tomorrow.   LOS: 1 day    [name] 10/30/2016, 8:18 AM

## 2016-10-30 NOTE — Anesthesia Procedure Notes (Signed)
Procedure Name: Intubation Date/Time: 10/30/2016 12:21 AM Performed by: Junious SilkGILBERT, Rylend Pietrzak Pre-anesthesia Checklist: Patient identified, Emergency Drugs available, Suction available, Patient being monitored and Timeout performed Patient Re-evaluated:Patient Re-evaluated prior to induction Oxygen Delivery Method: Circle system utilized Preoxygenation: Pre-oxygenation with 100% oxygen Induction Type: IV induction and Rapid sequence Laryngoscope Size: Miller and 2 Grade View: Grade I Tube type: Oral Tube size: 7.0 mm Number of attempts: 1 Airway Equipment and Method: Stylet Placement Confirmation: ETT inserted through vocal cords under direct vision,  positive ETCO2,  CO2 detector and breath sounds checked- equal and bilateral Secured at: 22 cm Tube secured with: Tape Dental Injury: Teeth and Oropharynx as per pre-operative assessment

## 2016-10-30 NOTE — Progress Notes (Signed)
CSW received consult for SA, NPNC and fetal demise.  CSW is familiar with patient from her delivery of preterm infant in [id number].  CSW attempted to meet with patient, but she was asleep.  She did not react to CSW's voice or knock on the door.  CSW asked RN staff to contact CSW when she wakes up.

## 2016-10-30 NOTE — Progress Notes (Signed)
CRITICAL VALUE ALERT  Critical Value: hgb 6.4  Date & Time Notied: 0620  Provider Notified: Dr Alysia PennaErvin  Orders Received/Actions taken: No orders

## 2016-10-30 NOTE — Op Note (Signed)
Johnell ComingsJessica V Fedorko PROCEDURE DATE: 10/30/2016  PREOPERATIVE DIAGNOSIS: Retained Placenta POSTOPERATIVE DIAGNOSIS: The same PROCEDURE:     Dilation and [place] SURGEON:  Dr. Casimiro NeedleMichael L. Carrol Hougland  INDICATIONS: 31 y.o. [id number] with retained placenta.  Risks of surgery were discussed with the patient including but not limited to: bleeding which may require transfusion; infection which may require antibiotics; injury to uterus or surrounding organs; need for additional procedures including laparotomy or laparoscopy; possibility of intrauterine scarring which may impair future fertility; and other postoperative/anesthesia complications. Written informed consent was obtained.    FINDINGS:  A 17week size uterus, moderate amounts of products of conception, specimen sent to pathology.  ANESTHESIA: GETA INTRAVENOUS FLUIDS:  1000 ml of LR ESTIMATED BLOOD LOSS:  Less than 20 ml. SPECIMENS:  Products of conception sent to pathology COMPLICATIONS:  None immediate.  PROCEDURE DETAILS:  She was then taken to the operating room where GETA was administered and was found to be adequate.  After an adequate timeout was performed, she was placed in the dorsal lithotomy position and examined; then prepped and draped in the sterile manner.   Her bladder was catheterized and foley was left in dwelling A vaginal speculum was then placed in the patient's vagina and a single tooth tenaculum was applied to the anterior lip of the cervix.Placental tissue was noted at the cervical os and easily removed.  A sharp curettage was then performed to confirm complete emptying of the uterus. There was minimal bleeding noted and the tenaculum removed with good hemostasis noted.   All instruments were removed from the patient's vagina.  Sponge and instrument counts were correct times two  The patient tolerated the procedure well and was taken to the recovery area awake, and in stable condition.   Riyanna Crutchley L. Alysia PennaErvin, MD, FACOG Attending  Obstetrician & Gynecologist Faculty Practice, [facility] [place]

## 2016-10-30 NOTE — Progress Notes (Signed)
Pt was talking with clinical social worker when this RN was asked to come in and talk with pt about her diagnosis and her plan of care. Education was provided about current infection with need for IV antibiotics and low Hgb along with the planned blood infusions for the day. Pt states that if she gets sick she will come back or go to an emergency room. Pt is unwilling to stay in the hospital at this time. Dr. Vergie LivingPickens was notified while in surgery of Pts desire to leave AMA. Pt signed AMA papers, both Periferal IVs were removed as well as her foley catheter education on trouble voiding was provided. Pt was observed to have increased bleeding while ambulating to the bathroom, pt declines further assistance. [facility]

## 2016-10-30 NOTE — Progress Notes (Signed)
GYN Note  I was called, while in the OR, that the patient wanted to leave AMA. When I finished in the OR, patient had left AMA. I called her at [phone] and the phone rang but no answer.  Patient also called at [phone] and it also rang but did no one picked up.  Patient counseled by RN to severity of signing out AMA and importance of needing to stay with treatment already d/w her earlier this morning by me.  Cornelia Copaharlie Melody Savidge, Jr MD Attending Center [facility] (Faculty Practice) 10/30/2016 Time: [phone]

## 2016-10-30 NOTE — Telephone Encounter (Signed)
Attempts made to reach patient and patient's mother at all 4 listed phone numbers, to call in antibiotic.  None of the 4 numbers are answered. No voicemails available on any of the numbers.

## 2016-10-30 NOTE — Progress Notes (Signed)
PHARMACY - PHYSICIAN COMMUNICATION CRITICAL VALUE ALERT - BLOOD CULTURE IDENTIFICATION (BCID)  Results for orders placed or performed during the hospital encounter of 10/29/16  Blood Culture ID Panel (Reflexed) (Collected: 10/29/2016  2:22 PM)  Result Value Ref Range   Enterococcus species NOT DETECTED NOT DETECTED   Listeria monocytogenes NOT DETECTED NOT DETECTED   Staphylococcus species NOT DETECTED NOT DETECTED   Staphylococcus aureus NOT DETECTED NOT DETECTED   Streptococcus species DETECTED (A) NOT DETECTED   Streptococcus agalactiae NOT DETECTED NOT DETECTED   Streptococcus pneumoniae NOT DETECTED NOT DETECTED   Streptococcus pyogenes DETECTED (A) NOT DETECTED   Acinetobacter baumannii NOT DETECTED NOT DETECTED   Enterobacteriaceae species NOT DETECTED NOT DETECTED   Enterobacter cloacae complex NOT DETECTED NOT DETECTED   Escherichia coli NOT DETECTED NOT DETECTED   Klebsiella oxytoca NOT DETECTED NOT DETECTED   Klebsiella pneumoniae NOT DETECTED NOT DETECTED   Proteus species NOT DETECTED NOT DETECTED   Serratia marcescens NOT DETECTED NOT DETECTED   Haemophilus influenzae NOT DETECTED NOT DETECTED   Neisseria meningitidis NOT DETECTED NOT DETECTED   Pseudomonas aeruginosa NOT DETECTED NOT DETECTED   Candida albicans NOT DETECTED NOT DETECTED   Candida glabrata NOT DETECTED NOT DETECTED   Candida krusei NOT DETECTED NOT DETECTED   Candida parapsilosis NOT DETECTED NOT DETECTED   Candida tropicalis NOT DETECTED NOT DETECTED    Name of physician (or Provider) Contacted: [place]  Changes to prescribed antibiotics required: Patient with documented Penicillin allergy.  Recommended Cefazolin 2 gm IV Q 8 hours or Vancomycin per protocol.  Jackelyn HoehnFoulks, Cayley Pester N 10/30/2016  10:27 AM

## 2016-10-30 NOTE — Progress Notes (Addendum)
GYN Note  Received call from pharmacy and patient has + BCx x 2 showing group A strep. ID Dr. Ninetta LightsHatcher to come see but is fine with IV ancef; allergies reviewed and patient received rocephin in the ED earlier this year and possible [facility] back in 12/2013 for GBS prophylaxis. He also recommends echocardiogram and is fine with d/c'ing the cipro/flagyl (she has received one dose pre and post op)  I went to go see her to let her know the plan of care. She is resting in her bed, ? Sleeping. I woke her up and she responds but then appears to fall back asleep when I'm talking but is able to verbalize what I just told her and respond to the fact that she'll likely need a week of hospital IV abx and she doesn't sound lethargic, almost as if she is not paying attention/feigning sleep. I d/w her re: the seriousness of her infection and need for hospital stay.   Plan of care also d/w nursing.  Await final ID recs  Patient Vitals for the past 24 hrs:  BP Temp Temp src Pulse Resp SpO2 Height Weight  10/30/16 0824 - 99 F (37.2 C) Oral - - - - -  10/30/16 0754 (!) 91/49 100.3 F (37.9 C) Axillary 95 18 100 % - -  10/30/16 0615 - 98.7 F (37.1 C) - - - - - -  10/30/16 0524 (!) 106/43 99.3 F (37.4 C) - 86 18 100 % - -  10/30/16 0316 (!) 100/48 99 F (37.2 C) - 91 18 100 % - -  10/30/16 0215 (!) 104/59 98.9 F (37.2 C) - 80 17 100 % - -  10/30/16 0200 - - - 89 (!) 21 96 % - -  10/30/16 0145 (!) 106/54 - - 85 (!) 22 97 % - -  10/30/16 0130 (!) 104/53 - - (!) 110 (!) 24 100 % - -  10/30/16 0115 112/61 98.5 F (36.9 C) - (!) 104 19 100 % - -  10/29/16 2352 (!) 86/45 99.7 F (37.6 C) - 87 - - - -  10/29/16 2300 - 99.7 F (37.6 C) - 100 - 100 % - -  10/29/16 2156 (!) 99/48 100.2 F (37.9 C) - (!) 107 - 99 % - -  10/29/16 1930 109/70 100 F (37.8 C) - (!) 103 - 97 % - -  10/29/16 1809 123/64 100.3 F (37.9 C) - 100 - 97 % - -  10/29/16 1745 119/63 - - (!) 103 - - - -   10/29/16 1717 - - - (!) 108 - 97 % - -  10/29/16 1712 - - - - - 96 % - -  10/29/16 1709 - - - (!) 103 - - - -  10/29/16 1708 (!) 98/55 99.7 F (37.6 C) Axillary (!) 136 19 - - -  10/29/16 1630 117/60 97.9 F (36.6 C) Axillary 98 16 99 % - -  10/29/16 1615 128/73 - - (!) 104 16 100 % - -  10/29/16 1600 115/67 - - 98 - 100 % - -  10/29/16 1545 115/73 - - 96 16 99 % - -  10/29/16 1534 117/73 - - 97 16 100 % - -  10/29/16 1430 94/71 - - 72 - 100 % - -  10/29/16 1415 116/67 - - 79 16 99 % - -  10/29/16 1400 123/77 - - 77 - 100 % - -  10/29/16 1352 112/71 - -  79 14 100 % - -  10/29/16 1330 112/71 - - 74 16 99 % - -  10/29/16 1315 96/62 - - 88 - 100 % - -  10/29/16 1300 (!) 106/55 - - (!) 58 - 100 % - -  10/29/16 1230 (!) 98/55 - - 62 - 100 % - -  10/29/16 1221 108/62 - - 70 (!) 22 100 % - -  10/29/16 1135 104/63 98 F (36.7 C) Oral 64 (!) 21 100 % 5\' 9"  (1.753 m) 59 kg (130 lb)   [place] Bingharlie Andreus Cure, Montez HagemanJr MD Attending Center [facility] ([facility]ulty [facility]) 10/30/2016 Time: [phone]

## 2016-10-31 LAB — HCV COMMENT:

## 2016-10-31 LAB — HEPATITIS C ANTIBODY (REFLEX)

## 2016-11-01 LAB — CULTURE, BLOOD (ROUTINE X 2)
SPECIAL REQUESTS: ADEQUATE
Special Requests: ADEQUATE

## 2016-11-01 LAB — TYPE AND SCREEN
ABO/RH(D): O POS
Antibody Screen: NEGATIVE
UNIT DIVISION: 0
UNIT DIVISION: 0
Unit division: 0

## 2016-11-01 LAB — BPAM RBC
BLOOD PRODUCT EXPIRATION DATE: 201808222359
Blood Product Expiration Date: 201808222359
Blood Product Expiration Date: 201808252359
UNIT TYPE AND RH: 5100
Unit Type and Rh: 5100
Unit Type and Rh: 5100

## 2016-11-06 ENCOUNTER — Encounter: Payer: Medicaid Other | Admitting: Obstetrics and Gynecology

## 2017-06-24 ENCOUNTER — Encounter (HOSPITAL_COMMUNITY): Payer: Self-pay | Admitting: Emergency Medicine

## 2017-06-24 ENCOUNTER — Other Ambulatory Visit: Payer: Self-pay

## 2017-06-24 NOTE — ED Notes (Signed)
Pt sleeping in lobby 

## 2017-06-24 NOTE — ED Triage Notes (Signed)
Pt arriving from home with complaint of abdominal pain after eating [place] food at 6pm. Pt received Zofran IM prior to arrival due to episode of emesis.   [id number]

## 2017-06-25 ENCOUNTER — Observation Stay (HOSPITAL_COMMUNITY)
Admission: EM | Admit: 2017-06-25 | Discharge: 2017-06-25 | DRG: 818 | Disposition: A | Discharge status: Home / Self Care | Payer: Self-pay | Attending: Obstetrics and Gynecology | Admitting: Obstetrics and Gynecology

## 2017-06-25 ENCOUNTER — Encounter (HOSPITAL_COMMUNITY)
Admission: EM | Disposition: A | Discharge status: Home / Self Care | Payer: Self-pay | Source: Home / Self Care | Attending: Emergency Medicine

## 2017-06-25 ENCOUNTER — Emergency Department (HOSPITAL_COMMUNITY): Payer: Self-pay

## 2017-06-25 ENCOUNTER — Encounter (HOSPITAL_COMMUNITY): Payer: Self-pay

## 2017-06-25 ENCOUNTER — Emergency Department (HOSPITAL_COMMUNITY): Payer: Self-pay | Admitting: Certified Registered Nurse Anesthetist

## 2017-06-25 DIAGNOSIS — O00102 Left tubal pregnancy without intrauterine pregnancy: Principal | ICD-10-CM | POA: Diagnosis present

## 2017-06-25 DIAGNOSIS — R102 Pelvic and perineal pain: Secondary | ICD-10-CM

## 2017-06-25 DIAGNOSIS — B9689 Other specified bacterial agents as the cause of diseases classified elsewhere: Secondary | ICD-10-CM | POA: Diagnosis present

## 2017-06-25 DIAGNOSIS — F1910 Other psychoactive substance abuse, uncomplicated: Secondary | ICD-10-CM

## 2017-06-25 DIAGNOSIS — Z880 Allergy status to penicillin: Secondary | ICD-10-CM

## 2017-06-25 DIAGNOSIS — O26899 Other specified pregnancy related conditions, unspecified trimester: Secondary | ICD-10-CM

## 2017-06-25 DIAGNOSIS — F1490 Cocaine use, unspecified, uncomplicated: Secondary | ICD-10-CM | POA: Diagnosis present

## 2017-06-25 DIAGNOSIS — O080 Genital tract and pelvic infection following ectopic and molar pregnancy: Secondary | ICD-10-CM | POA: Diagnosis present

## 2017-06-25 DIAGNOSIS — O00101 Right tubal pregnancy without intrauterine pregnancy: Secondary | ICD-10-CM

## 2017-06-25 DIAGNOSIS — F17210 Nicotine dependence, cigarettes, uncomplicated: Secondary | ICD-10-CM | POA: Diagnosis present

## 2017-06-25 DIAGNOSIS — Z3A17 17 weeks gestation of pregnancy: Secondary | ICD-10-CM

## 2017-06-25 DIAGNOSIS — R1900 Intra-abdominal and pelvic swelling, mass and lump, unspecified site: Secondary | ICD-10-CM

## 2017-06-25 HISTORY — PX: DIAGNOSTIC LAPAROSCOPY WITH REMOVAL OF ECTOPIC PREGNANCY: SHX6449

## 2017-06-25 HISTORY — PX: LAPAROSCOPIC UNILATERAL SALPINGECTOMY: SHX5934

## 2017-06-25 LAB — COMPREHENSIVE METABOLIC PANEL
ALBUMIN: 3.4 g/dL — AB (ref 3.5–5.0)
ALT: 10 U/L — ABNORMAL LOW (ref 14–54)
ANION GAP: 8 (ref 5–15)
AST: 18 U/L (ref 15–41)
Alkaline Phosphatase: 32 U/L — ABNORMAL LOW (ref 38–126)
BILIRUBIN TOTAL: 0.2 mg/dL — AB (ref 0.3–1.2)
BUN: 12 mg/dL (ref 6–20)
CHLORIDE: 104 mmol/L (ref 101–111)
CO2: 22 mmol/L (ref 22–32)
Calcium: 8.4 mg/dL — ABNORMAL LOW (ref 8.9–10.3)
Creatinine, Ser: 0.76 mg/dL (ref 0.44–1.00)
GFR calc Af Amer: >60 mL/min (ref >60)
Glucose, Bld: 109 mg/dL — ABNORMAL HIGH (ref 65–99)
POTASSIUM: 4 mmol/L (ref 3.5–5.1)
Sodium: 134 mmol/L — ABNORMAL LOW (ref 135–145)
TOTAL PROTEIN: 6.4 g/dL — AB (ref 6.5–8.1)

## 2017-06-25 LAB — URINALYSIS, ROUTINE W REFLEX MICROSCOPIC
BILIRUBIN URINE: NEGATIVE
Glucose, UA: NEGATIVE mg/dL
Ketones, ur: 20 mg/dL — AB
Leukocytes, UA: NEGATIVE
NITRITE: NEGATIVE
PROTEIN: NEGATIVE mg/dL
SPECIFIC GRAVITY, URINE: 1.026 (ref 1.005–1.030)
pH: 5 (ref 5.0–8.0)

## 2017-06-25 LAB — CBC
HCT: 24.6 % — ABNORMAL LOW (ref 36.0–46.0)
HCT: 25.3 % — ABNORMAL LOW (ref 36.0–46.0)
HEMATOCRIT: 27.2 % — AB (ref 36.0–46.0)
Hemoglobin: 9.1 g/dL — ABNORMAL LOW (ref 12.0–15.0)
Hemoglobin: 8.1 g/dL — ABNORMAL LOW (ref 12.0–15.0)
Hemoglobin: 8.7 g/dL — ABNORMAL LOW (ref 12.0–15.0)
MCH: 30.6 pg (ref 26.0–34.0)
MCH: 30.8 pg (ref 26.0–34.0)
MCH: 32 pg (ref 26.0–34.0)
MCHC: 33.5 g/dL (ref 30.0–36.0)
MCHC: 32.9 g/dL (ref 30.0–36.0)
MCHC: 34.4 g/dL (ref 30.0–36.0)
MCV: 91.6 fL (ref 78.0–100.0)
MCV: 93.5 fL (ref 78.0–100.0)
MCV: 93 fL (ref 78.0–100.0)
PLATELETS: 205 10*3/uL (ref 150–400)
PLATELETS: 211 10*3/uL (ref 150–400)
Platelets: 226 10*3/uL (ref 150–400)
RBC: 2.97 MIL/uL — ABNORMAL LOW (ref 3.87–5.11)
RBC: 2.63 MIL/uL — ABNORMAL LOW (ref 3.87–5.11)
RBC: 2.72 MIL/uL — ABNORMAL LOW (ref 3.87–5.11)
RDW: 14.4 % (ref 11.5–15.5)
RDW: 14.2 % (ref 11.5–15.5)
RDW: 14.4 % (ref 11.5–15.5)
WBC: 9.5 10*3/uL (ref 4.0–10.5)
WBC: 9.4 10*3/uL (ref 4.0–10.5)
WBC: 8.1 10*3/uL (ref 4.0–10.5)

## 2017-06-25 LAB — CBC WITH DIFFERENTIAL/PLATELET
BASOS PCT: 0 %
Basophils Absolute: 0 10*3/uL (ref 0.0–0.1)
Eosinophils Absolute: 0 10*3/uL (ref 0.0–0.7)
Eosinophils Relative: 0 %
HEMATOCRIT: 33.1 % — AB (ref 36.0–46.0)
HEMOGLOBIN: 11 g/dL — AB (ref 12.0–15.0)
LYMPHS ABS: 2.1 10*3/uL (ref 0.7–4.0)
LYMPHS PCT: 18 %
MCH: 31 pg (ref 26.0–34.0)
MCHC: 33.2 g/dL (ref 30.0–36.0)
MCV: 93.2 fL (ref 78.0–100.0)
MONO ABS: 0.6 10*3/uL (ref 0.1–1.0)
MONOS PCT: 5 %
NEUTROS ABS: 9.1 10*3/uL — AB (ref 1.7–7.7)
NEUTROS PCT: 77 %
Platelets: 267 10*3/uL (ref 150–400)
RBC: 3.55 MIL/uL — ABNORMAL LOW (ref 3.87–5.11)
RDW: 14.2 % (ref 11.5–15.5)
WBC: 11.9 10*3/uL — ABNORMAL HIGH (ref 4.0–10.5)

## 2017-06-25 LAB — TYPE AND SCREEN
ABO/RH(D): O POS
Antibody Screen: NEGATIVE

## 2017-06-25 LAB — WET PREP, GENITAL
Sperm: NONE SEEN
Trich, Wet Prep: NONE SEEN
Yeast Wet Prep HPF POC: NONE SEEN

## 2017-06-25 LAB — HCG, QUANTITATIVE, PREGNANCY: HCG, BETA CHAIN, QUANT, S: 47829 m[IU]/mL — AB (ref <5)

## 2017-06-25 LAB — RAPID URINE DRUG SCREEN, HOSP PERFORMED
AMPHETAMINES: NOT DETECTED
BENZODIAZEPINES: NOT DETECTED
Barbiturates: NOT DETECTED
COCAINE: POSITIVE — AB
Opiates: NOT DETECTED
Tetrahydrocannabinol: POSITIVE — AB

## 2017-06-25 LAB — PREGNANCY, URINE: Preg Test, Ur: POSITIVE — AB

## 2017-06-25 LAB — GC/CHLAMYDIA PROBE AMP (~~LOC~~) NOT AT ARMC
CHLAMYDIA, DNA PROBE: NEGATIVE
NEISSERIA GONORRHEA: NEGATIVE

## 2017-06-25 LAB — ETHANOL: Alcohol, Ethyl (B): <10 mg/dL (ref <10)

## 2017-06-25 LAB — LIPASE, BLOOD: LIPASE: 19 U/L (ref 11–51)

## 2017-06-25 SURGERY — Surgical Case
Anesthesia: *Unknown

## 2017-06-25 SURGERY — LAPAROSCOPY, WITH ECTOPIC PREGNANCY SURGICAL TREATMENT
Anesthesia: General | Site: Abdomen

## 2017-06-25 MED ORDER — HYDROMORPHONE HCL 1 MG/ML IJ SOLN: 0.2500 mg | INTRAMUSCULAR | Status: DC | PRN

## 2017-06-25 MED ORDER — IBUPROFEN 600 MG PO TABS: 600.0000 mg | ORAL_TABLET | Freq: Four times a day (QID) | ORAL | 3 refills | Status: DC | PRN

## 2017-06-25 MED ORDER — SUCCINYLCHOLINE CHLORIDE 20 MG/ML IJ SOLN
INTRAMUSCULAR | Status: DC | PRN
  Administered 2017-06-25: 80 mg via INTRAVENOUS

## 2017-06-25 MED ORDER — DOCUSATE SODIUM 100 MG PO CAPS: 100.0000 mg | ORAL_CAPSULE | Freq: Two times a day (BID) | ORAL | 2 refills | Status: DC | PRN

## 2017-06-25 MED ORDER — AMMONIA AROMATIC IN INHA
RESPIRATORY_TRACT | Status: AC
  Filled 2017-06-25: qty 10

## 2017-06-25 MED ORDER — KETOROLAC TROMETHAMINE 30 MG/ML IJ SOLN: 30.0000 mg | Freq: Once | INTRAMUSCULAR | Status: DC

## 2017-06-25 MED ORDER — FERROUS SULFATE 325 (65 FE) MG PO TABS: 325.0000 mg | ORAL_TABLET | Freq: Two times a day (BID) | ORAL | 1 refills | Status: DC

## 2017-06-25 MED ORDER — LACTATED RINGERS IV SOLN
INTRAVENOUS | Status: DC | PRN
  Administered 2017-06-25: 10:00:00 via INTRAVENOUS

## 2017-06-25 MED ORDER — KETOROLAC TROMETHAMINE 30 MG/ML IJ SOLN
INTRAMUSCULAR | Status: DC | PRN
  Administered 2017-06-25: 30 mg via INTRAVENOUS

## 2017-06-25 MED ORDER — SODIUM CHLORIDE 0.9 % IV BOLUS (SEPSIS)
1000.0000 mL | Freq: Once | INTRAVENOUS | Status: AC
  Administered 2017-06-25: 1000 mL via INTRAVENOUS

## 2017-06-25 MED ORDER — SUGAMMADEX SODIUM 200 MG/2ML IV SOLN
INTRAVENOUS | Status: DC | PRN
  Administered 2017-06-25: 120 mg via INTRAVENOUS

## 2017-06-25 MED ORDER — ONDANSETRON HCL 4 MG/2ML IJ SOLN
4.0000 mg | Freq: Once | INTRAMUSCULAR | Status: AC
  Administered 2017-06-25: 4 mg via INTRAVENOUS
  Filled 2017-06-25: qty 2

## 2017-06-25 MED ORDER — DEXAMETHASONE SODIUM PHOSPHATE 4 MG/ML IJ SOLN
INTRAMUSCULAR | Status: DC | PRN
  Administered 2017-06-25: 4 mg via INTRAVENOUS

## 2017-06-25 MED ORDER — IBUPROFEN 600 MG PO TABS: 600.0000 mg | ORAL_TABLET | Freq: Four times a day (QID) | ORAL | 3 refills | Status: AC | PRN

## 2017-06-25 MED ORDER — DEXMEDETOMIDINE HCL 200 MCG/2ML IV SOLN
INTRAVENOUS | Status: DC | PRN
  Administered 2017-06-25 (×2): 20 ug via INTRAVENOUS

## 2017-06-25 MED ORDER — ROCURONIUM BROMIDE 100 MG/10ML IV SOLN
INTRAVENOUS | Status: DC | PRN
  Administered 2017-06-25: 20 mg via INTRAVENOUS
  Administered 2017-06-25: 10 mg via INTRAVENOUS

## 2017-06-25 MED ORDER — FAMOTIDINE IN NACL 20-0.9 MG/50ML-% IV SOLN
20.0000 mg | Freq: Once | INTRAVENOUS | Status: AC
  Administered 2017-06-25: 20 mg via INTRAVENOUS
  Filled 2017-06-25: qty 50

## 2017-06-25 MED ORDER — SODIUM CHLORIDE 0.9 % IR SOLN
Status: DC | PRN
Start: 2017-06-25 — End: 2017-06-25
  Administered 2017-06-25: 3000 mL

## 2017-06-25 MED ORDER — ACETAMINOPHEN 10 MG/ML IV SOLN: 1000.0000 mg | Freq: Once | INTRAVENOUS | Status: DC | PRN

## 2017-06-25 MED ORDER — ONDANSETRON HCL 4 MG/2ML IJ SOLN
INTRAMUSCULAR | Status: DC | PRN
  Administered 2017-06-25: 4 mg via INTRAVENOUS

## 2017-06-25 MED ORDER — PROPOFOL 10 MG/ML IV BOLUS
INTRAVENOUS | Status: DC | PRN
  Administered 2017-06-25: 120 mg via INTRAVENOUS

## 2017-06-25 MED ORDER — BUPIVACAINE HCL (PF) 0.25 % IJ SOLN
INTRAMUSCULAR | Status: DC | PRN
  Administered 2017-06-25: 6 mL

## 2017-06-25 MED ORDER — LIDOCAINE HCL (CARDIAC) 20 MG/ML IV SOLN
INTRAVENOUS | Status: DC | PRN
  Administered 2017-06-25: 60 mg via INTRAVENOUS

## 2017-06-25 MED ORDER — MEPERIDINE HCL 25 MG/ML IJ SOLN: 6.2500 mg | INTRAMUSCULAR | Status: DC | PRN

## 2017-06-25 MED ORDER — LACTATED RINGERS IV SOLN
INTRAVENOUS | Status: DC | PRN
  Administered 2017-06-25 (×2): via INTRAVENOUS

## 2017-06-25 MED ORDER — OXYCODONE-ACETAMINOPHEN 5-325 MG PO TABS: 1.0000 | ORAL_TABLET | Freq: Four times a day (QID) | ORAL | 0 refills | Status: DC | PRN

## 2017-06-25 MED ORDER — ACETAMINOPHEN 325 MG PO TABS
650.0000 mg | ORAL_TABLET | Freq: Once | ORAL | Status: AC
  Administered 2017-06-25: 650 mg via ORAL
  Filled 2017-06-25: qty 2

## 2017-06-25 MED ORDER — FENTANYL CITRATE (PF) 250 MCG/5ML IJ SOLN
INTRAMUSCULAR | Status: DC | PRN
  Administered 2017-06-25 (×7): 50 ug via INTRAVENOUS

## 2017-06-25 MED ORDER — HYDROCODONE-ACETAMINOPHEN 7.5-325 MG PO TABS: 1.0000 | ORAL_TABLET | Freq: Once | ORAL | Status: DC | PRN

## 2017-06-25 MED ORDER — PROMETHAZINE HCL 25 MG/ML IJ SOLN: 6.2500 mg | INTRAMUSCULAR | Status: DC | PRN

## 2017-06-25 SURGICAL SUPPLY — 31 items
ADH SKN CLS APL DERMABOND .7 (GAUZE/BANDAGES/DRESSINGS) ×2
BAG SPEC RTRVL LRG 6X4 10 (ENDOMECHANICALS) ×2
DERMABOND ADVANCED (GAUZE/BANDAGES/DRESSINGS) ×2
DERMABOND ADVANCED .7 DNX12 (GAUZE/BANDAGES/DRESSINGS) ×2 IMPLANT
DRSG OPSITE POSTOP 3X4 (GAUZE/BANDAGES/DRESSINGS) ×2 IMPLANT
DURAPREP 26ML APPLICATOR (WOUND CARE) ×4 IMPLANT
ELECT REM PT RETURN 9FT ADLT (ELECTROSURGICAL) ×4
ELECTRODE REM PT RTRN 9FT ADLT (ELECTROSURGICAL) IMPLANT
GLOVE BIOGEL PI IND STRL 6.5 (GLOVE) ×4 IMPLANT
GLOVE BIOGEL PI IND STRL 7.0 (GLOVE) ×4 IMPLANT
GLOVE BIOGEL PI INDICATOR 6.5 (GLOVE) ×4
GLOVE BIOGEL PI INDICATOR 7.0 (GLOVE) ×4
GLOVE SURG SS PI 6.0 STRL IVOR (GLOVE) ×4 IMPLANT
GOWN STRL REUS W/TWL LRG LVL3 (GOWN DISPOSABLE) ×8 IMPLANT
NS IRRIG 1000ML POUR BTL (IV SOLUTION) ×4 IMPLANT
PACK LAPAROSCOPY BASIN (CUSTOM PROCEDURE TRAY) ×4 IMPLANT
PACK TRENDGUARD 450 HYBRID PRO (MISCELLANEOUS) IMPLANT
PACK TRENDGUARD 600 HYBRD PROC (MISCELLANEOUS) IMPLANT
POUCH SPECIMEN RETRIEVAL 10MM (ENDOMECHANICALS) ×2 IMPLANT
PROTECTOR NERVE ULNAR (MISCELLANEOUS) ×8 IMPLANT
SET IRRIG TUBING LAPAROSCOPIC (IRRIGATION / IRRIGATOR) ×2 IMPLANT
SHEARS HARMONIC ACE PLUS 36CM (ENDOMECHANICALS) ×2 IMPLANT
SLEEVE XCEL OPT CAN 5 100 (ENDOMECHANICALS) ×4 IMPLANT
SUT MNCRL AB 4-0 PS2 18 (SUTURE) ×4 IMPLANT
SUT VICRYL 0 UR6 27IN ABS (SUTURE) ×8 IMPLANT
TOWEL OR 17X24 6PK STRL BLUE (TOWEL DISPOSABLE) ×8 IMPLANT
TRAY FOLEY CATH SILVER 14FR (SET/KITS/TRAYS/PACK) ×4 IMPLANT
TRENDGUARD 450 HYBRID PRO PACK (MISCELLANEOUS) ×4
TRENDGUARD 600 HYBRID PROC PK (MISCELLANEOUS)
TROCAR BALLN 12MMX100 BLUNT (TROCAR) ×4 IMPLANT
TROCAR XCEL NON-BLD 5MMX100MML (ENDOMECHANICALS) ×4 IMPLANT

## 2017-06-25 NOTE — ED Notes (Signed)
One bag of pt belongings sent with [facility]/pt.

## 2017-06-25 NOTE — ED Provider Notes (Signed)
[facility]-EMERGENCY DEPT Provider Note   CSN: [id number] Arrival date & time: 06/24/17  2251     History   Chief Complaint Chief Complaint  Patient presents with  . Abdominal Pain  . Emesis    HPI [name] is a 32 y.o. female.  32 year old [id number] female with history of schizophrenia, miscarriage (July 2018) and polysubstance abuse presents to the emergency department for complaints of abdominal pain.  She does endorse drinking alcohol today.  She is complaining of pain in her abdomen which has been constant.  She was noted to be vomiting on EMS arrival.  Zofran given in route to the emergency department.  Triage note references abdominal pain after eating [place] food at 1800; however, patient does not specifically confirm this story.  She is very vague regarding her onset of symptoms and reasons for calling EMS tonight.  She will fade off to sleep when not stimulated.  The history is provided by the patient. No language interpreter was used.  Abdominal Pain   Associated symptoms include vomiting.  Emesis   Associated symptoms include abdominal pain.    Past Medical History:  Diagnosis Date  . Asthma   . Current smoker   . Gonorrhea affecting pregnancy in second trimester, antepartum 11/07/2013   +GC 8/315  Notified 8/4, appt to come in for rocephin, azithromycin rx sent in       POC: negative 8/26   . Polysubstance abuse (HCC)   . Schizophrenia [facility])     Patient Active Problem List   Diagnosis Date Noted  . SAB (spontaneous abortion) 10/30/2016  . Beta-hemolytic group A streptococcal sepsis (HCC) 10/30/2016  . Postpartum care following vaginal delivery 10/29/2016  . Status post preterm vaginal delivery 01/02/2014  . Preterm labor in second trimester with preterm delivery in second trimester 01/01/2014  . Short cervix in second trimester, antepartum 11/29/2013  . Gonorrhea affecting pregnancy in second trimester, antepartum 11/07/2013  .  Illicit drug use, including cocaine 11/07/2013  . Supervision of high-risk pregnancy d/t cocaine use 11/06/2013  . Late prenatal care affecting pregnancy 11/06/2013  . Depression with anxiety 11/06/2013    Past Surgical History:  Procedure Laterality Date  . BREAST ENHANCEMENT SURGERY    . breat implants    . DILATION AND CURETTAGE OF UTERUS N/A 10/30/2016   Procedure: DILATATION AND CURETTAGE;  Surgeon: Hermina Staggers, MD;  Location: [facility];  Service: Gynecology;  Laterality: N/A;    OB History    Gravida  4   Para  2   Term  1   Preterm  1   AB  1   Living  2     SAB  0   TAB  1   Ectopic  0   Multiple  0   Live Births  2            Home Medications    Prior to Admission medications   Medication Sig Start Date End Date Taking? Authorizing Provider  naproxen sodium (ALEVE) 220 MG tablet Take 220 mg by mouth 2 (two) times daily as needed (pain).   Yes [provider]  tetrahydrozoline 0.05 % ophthalmic solution Place 1 drop into both eyes 3 (three) times daily as needed (dry eyes).   Yes [provider]  albuterol (PROVENTIL HFA;VENTOLIN HFA) 108 (90 Base) MCG/ACT inhaler Inhale 1-2 puffs into the lungs every 6 (six) hours as needed for wheezing or shortness of breath.    [provider]    Family History No family history on file.  Social History Social History   Tobacco Use  . Smoking status: Current Some Day Smoker    Packs/day: 0.10    Types: Cigarettes  . Smokeless tobacco: Never Used  Substance Use Topics  . Alcohol use: Yes  . Drug use: No    Types: Marijuana    Comment: Occasional, denies any use today,      Allergies   Penicillins   Review of Systems Review of Systems  Gastrointestinal: Positive for abdominal pain and vomiting.  Ten systems reviewed and are negative for acute change, except as noted in the HPI.    Physical Exam Updated Vital Signs BP (!) 109/55   Pulse 66   Resp 16    SpO2 100%   Physical Exam  Constitutional: She is oriented to person, place, and time. She appears well-developed and well-nourished. No distress.  Slightly disheveled, sleepy.  Will fall back to sleep when not stimulated.  HENT:  Head: Normocephalic and atraumatic.  Eyes: Conjunctivae and EOM are normal. No scleral icterus.  Neck: Normal range of motion.  Cardiovascular: Normal rate, regular rhythm and intact distal pulses.  Pulmonary/Chest: Effort normal. No stridor. No respiratory distress.  Respirations even and unlabored  Abdominal: She exhibits no distension and no mass. There is tenderness (Generalized, diffuse). There is guarding (Voluntary).  Genitourinary:  Genitourinary Comments: White thin discharge. Uterine TTP without CMT or adnexal TTP. No friability to cervix.  Musculoskeletal: Normal range of motion.  Neurological: She is alert and oriented to person, place, and time. She exhibits normal muscle tone. Coordination normal.  Ambulatory with steady gait  Skin: Skin is warm and dry. No rash noted. She is not diaphoretic. No erythema. No pallor.  Psychiatric: She has a normal mood and affect. Her behavior is normal.  Bizarre affect.  Nursing note and vitals reviewed.    ED Treatments / Results  Labs (all labs ordered are listed, but only abnormal results are displayed) Labs Reviewed  CBC WITH DIFFERENTIAL/PLATELET - Abnormal; Notable for the following components:      Result Value   WBC 11.9 (*)    RBC 3.55 (*)    Hemoglobin 11.0 (*)    HCT 33.1 (*)    Neutro Abs 9.1 (*)    All other components within normal limits  URINALYSIS, ROUTINE W REFLEX MICROSCOPIC - Abnormal; Notable for the following components:   Hgb urine dipstick SMALL (*)    Ketones, ur 20 (*)    Bacteria, UA RARE (*)    Squamous Epithelial / LPF 0-5 (*)    All other components within normal limits  PREGNANCY, URINE - Abnormal; Notable for the following components:   Preg Test, Ur POSITIVE (*)      All other components within normal limits  COMPREHENSIVE METABOLIC PANEL - Abnormal; Notable for the following components:   Sodium 134 (*)    Glucose, Bld 109 (*)    Calcium 8.4 (*)    Total Protein 6.4 (*)    Albumin 3.4 (*)    ALT 10 (*)    Alkaline Phosphatase 32 (*)    Total Bilirubin 0.2 (*)    All other components within normal limits  RAPID URINE DRUG SCREEN, HOSP PERFORMED - Abnormal; Notable for the following components:   Cocaine POSITIVE (*)    Tetrahydrocannabinol POSITIVE (*)    All other components within normal limits  WET PREP, GENITAL  LIPASE, BLOOD  ETHANOL  HCG, QUANTITATIVE, PREGNANCY  GC/CHLAMYDIA PROBE AMP ([place]) NOT AT [facility]    EKG  EKG Interpretation None       Radiology No results found.  Procedures Procedures (including critical care time)  Medications Ordered in ED Medications  acetaminophen (TYLENOL) tablet 650 mg (has no administration in time range)  sodium chloride 0.9 % bolus 1,000 mL (0 mLs Intravenous Stopped 06/25/17 0549)  famotidine (PEPCID) IVPB 20 mg premix (0 mg Intravenous Stopped 06/25/17 0328)  ondansetron (ZOFRAN) injection 4 mg (4 mg Intravenous Given 06/25/17 0145)    6:26 AM Patient with positive pregnancy. Will need US to evaluate for complications. No bleeding at this time. LMP 2 months ago.   Initial Impression / Assessment and Plan / ED Course  I have reviewed the triage vital signs and the nursing notes.  Pertinent labs & imaging results that were available during my care of the patient were reviewed by me and considered in my medical decision making (see chart for details).     32 year old female presents to the emergency department for complaints of abdominal pain and vomiting.  She was very difficult to obtain a history from and has been intermittently uncooperative since arrival.  Will quickly doze off to sleep when not stimulated.  The patient did complain of abdominal pain on arrival.  Laboratory  workup was initially reassuring, but urinalysis pending for many hours.  The patient was hydrated with IV fluids and was given Zofran and Pepcid for management of nausea.  She has not had any vomiting since arrival to the ED.  After receiving urinalysis, patient found to have a positive pregnancy test.  This is her fifth total pregnancy and she has history of miscarriage in July.  She reports that her last menstrual period was 2 months ago.  She has some uterine tenderness on exam.  Will obtain OB ultrasound to evaluate for complication.  Tylenol ordered for pain control.  She is notably positive for cocaine and marijuana today.  Will avoid the use of narcotics.  Patient signed out to Aviva KluverAlyssa Murray, PA-C at change of shift who will follow-up on imaging and disposition appropriately.   Final Clinical Impressions(s) / ED Diagnoses   Final diagnoses:  Pelvic pain in pregnancy  Polysubstance abuse [facility](HCC)    ED Discharge Orders    None       Antony MaduraHumes, Salimatou Simone, PA-C 06/25/17 0644    Paula LibraMolpus, John, MD 06/25/17 [phone]

## 2017-06-25 NOTE — ED Notes (Signed)
ED TO INPATIENT HANDOFF REPORT  Name/Age/Gender [name] 32 y.o. female  Code Status Code Status History    Date Active Date Inactive Code Status Order ID Comments User Context   10/29/2016 1850 10/30/2016 1916 Full Code [id number]  Chancy Milroy, MD Inpatient   01/02/2014 0052 01/03/2014 1505 Full Code [id number]  Truett Mainland, DO Inpatient   01/01/2014 1843 01/02/2014 0052 Full Code [id number]  Helene Kelp, RN Inpatient      Home/SNF/Other Home  Chief Complaint ABD PAIN; EMESIS  Level of Care/Admitting Diagnosis ED Disposition    ED Disposition Condition Comment   Transfer to Another Facility  The patient appears reasonably stabilized for transfer considering the current resources, flow, and capabilities available in the ED at this time, and I doubt any other [facility] requiring further screening and/or treatment in the ED prior to transfer is p resent.       Medical History Past Medical History:  Diagnosis Date  . Asthma   . Current smoker   . Gonorrhea affecting pregnancy in second trimester, antepartum 11/07/2013   +GC 8/315  Notified 8/4, appt to come in for rocephin, azithromycin rx sent in       POC: negative 8/26   . Polysubstance abuse ([place])   . Schizophrenia ([place])     Allergies Allergies  Allergen Reactions  . Penicillins Hives    Has patient had a PCN reaction causing immediate rash, facial/tongue/throat swelling, SOB or lightheadedness with hypotension: Yes Has patient had a PCN reaction causing severe rash involving mucus membranes or skin necrosis: No Has patient had a PCN reaction that required hospitalization: No Has patient had a PCN reaction occurring within the last 10 years: No If all of the above answers are "NO", then may proceed with Cephalosporin use.     IV Location/Drains/Wounds Patient Lines/Drains/Airways Status   Active Line/Drains/Airways    Name:   Placement date:   Placement time:   Site:   Days:   Peripheral IV 06/25/17  Left Antecubital   06/25/17    0140    Antecubital   less than 1   Urethral Catheter C Stewart, RN Latex   10/30/16    0025    Latex   238   Incision (Closed) 10/30/16 Vagina   10/30/16    0059     238          Labs/Imaging Results for orders placed or performed during the hospital encounter of 06/25/17 (from the past 48 hour(s))  CBC with Differential     Status: Abnormal   Collection Time: 06/25/17  1:44 AM  Result Value Ref Range   WBC 11.9 (H) 4.0 - 10.5 K/uL   RBC 3.55 (L) 3.87 - 5.11 MIL/uL   Hemoglobin 11.0 (L) 12.0 - 15.0 g/dL   HCT 33.1 (L) 36.0 - 46.0 %   MCV 93.2 78.0 - 100.0 fL   MCH 31.0 26.0 - 34.0 pg   MCHC 33.2 30.0 - 36.0 g/dL   RDW 14.2 11.5 - 15.5 %   Platelets 267 150 - 400 K/uL   Neutrophils Relative % 77 %   Neutro Abs 9.1 (H) 1.7 - 7.7 K/uL   Lymphocytes Relative 18 %   Lymphs Abs 2.1 0.7 - 4.0 K/uL   Monocytes Relative 5 %   Monocytes Absolute 0.6 0.1 - 1.0 K/uL   Eosinophils Relative 0 %   Eosinophils Absolute 0.0 0.0 - 0.7 K/uL   Basophils Relative 0 %  Basophils Absolute 0.0 0.0 - 0.1 K/uL    Comment: Performed at [facility], [place] [address], [place], [place] [address]  Lipase, blood     Status: None   Collection Time: 06/25/17  1:44 AM  Result Value Ref Range   Lipase 19 11 - 51 U/L    Comment: Performed at [facility], [place] [address] [place], [place] [address]  Comprehensive metabolic panel     Status: Abnormal   Collection Time: 06/25/17  1:44 AM  Result Value Ref Range   Sodium 134 (L) 135 - 145 mmol/L   Potassium 4.0 3.5 - 5.1 mmol/L   Chloride 104 101 - 111 mmol/L   CO2 22 22 - 32 mmol/L   Glucose, Bld 109 (H) 65 - 99 mg/dL   BUN 12 6 - 20 mg/dL   Creatinine, Ser 0.76 0.44 - 1.00 mg/dL   Calcium 8.4 (L) 8.9 - 10.3 mg/dL   Total Protein 6.4 (L) 6.5 - 8.1 g/dL   Albumin 3.4 (L) 3.5 - 5.0 g/dL   AST 18 15 - 41 U/L   ALT 10 (L) 14 - 54 U/L   Alkaline Phosphatase 32 (L) 38 - 126 U/L   Total  Bilirubin 0.2 (L) 0.3 - 1.2 mg/dL   GFR calc non Af Amer >60 >60 mL/min   GFR calc Af Amer >60 >60 mL/min    Comment: (NOTE) The eGFR has been calculated using the CKD EPI equation. This calculation has not been validated in all clinical situations. eGFR's persistently <60 mL/min signify possible Chronic Kidney Disease.    Anion gap 8 5 - 15    Comment: Performed at [facility], [place] [address] [place],  [address]  Ethanol     Status: None   Collection Time: 06/25/17  1:44 AM  Result Value Ref Range   Alcohol, Ethyl (B) <10 <10 mg/dL    Comment:        LOWEST DETECTABLE LIMIT FOR SERUM ALCOHOL IS 10 mg/dL FOR MEDICAL PURPOSES ONLY Performed at [place] [address] [place], [place] [address]   hCG, quantitative, pregnancy     Status: Abnormal   Collection Time: 06/25/17  1:44 AM  Result Value Ref Range   hCG, Beta Chain, Quant, S 47,829 (H) <5 mIU/mL    Comment:          GEST. AGE      CONC.  (mIU/mL)   <=1 WEEK        5 - 50     2 WEEKS       50 - 500     3 WEEKS       100 - 10,000     4 WEEKS     1,000 - 30,000     5 WEEKS     3,500 - 115,000   6-8 WEEKS     12,000 - 270,000    12 WEEKS     15,000 - 220,000        FEMALE AND NON-PREGNANT FEMALE:     LESS THAN 5 mIU/mL Performed at [facility], [place] [address], [place], [place] [address]   Wet prep, genital     Status: Abnormal   Collection Time: 06/25/17  5:47 AM  Result Value Ref Range   Yeast Wet Prep HPF POC NONE SEEN NONE SEEN   Trich, Wet Prep NONE SEEN NONE SEEN   Clue Cells Wet Prep HPF POC  PRESENT (A) NONE SEEN   WBC, Wet Prep HPF POC MANY (A) NONE SEEN   Sperm NONE SEEN     Comment: Performed at [facility], [place] [address], [place], [place] [address]  Urinalysis, Routine w reflex microscopic     Status: Abnormal   Collection Time: 06/25/17  5:50 AM  Result Value Ref Range   Color, Urine YELLOW YELLOW   APPearance  CLEAR CLEAR   Specific Gravity, Urine 1.026 1.005 - 1.030   pH 5.0 5.0 - 8.0   Glucose, UA NEGATIVE NEGATIVE mg/dL   Hgb urine dipstick SMALL (A) NEGATIVE   Bilirubin Urine NEGATIVE NEGATIVE   Ketones, ur 20 (A) NEGATIVE mg/dL   Protein, ur NEGATIVE NEGATIVE mg/dL   Nitrite NEGATIVE NEGATIVE   Leukocytes, UA NEGATIVE NEGATIVE   RBC / HPF 0-5 0 - 5 RBC/hpf   WBC, UA 0-5 0 - 5 WBC/hpf   Bacteria, UA RARE (A) NONE SEEN   Squamous Epithelial / LPF 0-5 (A) NONE SEEN   Mucus PRESENT     Comment: Performed at [facility], [place] [address], [place], [place] [address]  Pregnancy, urine     Status: Abnormal   Collection Time: 06/25/17  5:50 AM  Result Value Ref Range   Preg Test, Ur POSITIVE (A) NEGATIVE    Comment: REPEATED TO VERIFY        THE SENSITIVITY OF THIS METHODOLOGY IS >20 mIU/mL. Performed at [facility], [place] [address] [place], [place] [address]   Rapid urine drug screen (hospital performed)     Status: Abnormal   Collection Time: 06/25/17  5:50 AM  Result Value Ref Range   Opiates NONE DETECTED NONE DETECTED   Cocaine POSITIVE (A) NONE DETECTED   Benzodiazepines NONE DETECTED NONE DETECTED   Amphetamines NONE DETECTED NONE DETECTED   Tetrahydrocannabinol POSITIVE (A) NONE DETECTED   Barbiturates NONE DETECTED NONE DETECTED    Comment: (NOTE) DRUG SCREEN FOR MEDICAL PURPOSES ONLY.  IF CONFIRMATION IS NEEDED FOR ANY PURPOSE, NOTIFY LAB WITHIN 5 DAYS. LOWEST DETECTABLE LIMITS FOR URINE DRUG SCREEN Drug Class                     Cutoff (ng/mL) Amphetamine and metabolites    1000 Barbiturate and metabolites    200 Benzodiazepine                 482 Tricyclics and metabolites     300 Opiates and metabolites        300 Cocaine and metabolites        300 THC                            50 Performed at [facility], [place] [address] [place], [place] [address]    US Ob Comp Less 14 Wks  Result Date:  06/25/2017 CLINICAL DATA:  Pelvic pain, early pregnancy, quantitative beta HCG 47,829 EXAM: OBSTETRIC <14 WK [place] AND TRANSVAGINAL OB US TECHNIQUE: Both transabdominal and transvaginal ultrasound examinations were performed for complete evaluation of the gestation as well as the maternal uterus, adnexal regions, and pelvic cul-de-sac. Transvaginal technique was performed to assess early pregnancy. COMPARISON:  10/29/2016 FINDINGS: Intrauterine gestational sac: Absent Yolk sac:  Absent Embryo:  Absent Cardiac Activity: Absent Heart Rate: N/A  bpm Subchorionic hemorrhage:  None visualized. Maternal uterus/adnexae: Heterogeneous right adnexal mass 6.8 by 4.1 by 2.1 cm. Hemoperitoneum noted  in the cul-de-sac, moderate in size. No recognizable fetal cardiac activity or recognizable gestational sac is identified in the right adnexa. IMPRESSION: 1. Lack of intrauterine gestational sac with a heterogeneous complex right adnexal mass and moderate hemoperitoneum. Appearance highly suspicious for right-sided ectopic pregnancy, although an obvious fetal pole or fetal cardiac activity outside of the uterus is not directly visualized. Critical Value/emergent results were called by telephone at the time of interpretation on 06/25/2017 at 8:17 am to [place], [place], who verbally acknowledged these results. Electronically Signed   By: Van Clines M.D.   On: 06/25/2017 08:24   US Ob Transvaginal  Result Date: 06/25/2017 CLINICAL DATA:  Pelvic pain, early pregnancy, quantitative beta HCG 47,829 EXAM: OBSTETRIC <14 WK [place] AND TRANSVAGINAL OB US TECHNIQUE: Both transabdominal and transvaginal ultrasound examinations were performed for complete evaluation of the gestation as well as the maternal uterus, adnexal regions, and pelvic cul-de-sac. Transvaginal technique was performed to assess early pregnancy. COMPARISON:  10/29/2016 FINDINGS: Intrauterine gestational sac: Absent Yolk sac:  Absent Embryo:  Absent Cardiac Activity: Absent  Heart Rate: N/A  bpm Subchorionic hemorrhage:  None visualized. Maternal uterus/adnexae: Heterogeneous right adnexal mass 6.8 by 4.1 by 2.1 cm. Hemoperitoneum noted in the cul-de-sac, moderate in size. No recognizable fetal cardiac activity or recognizable gestational sac is identified in the right adnexa. IMPRESSION: 1. Lack of intrauterine gestational sac with a heterogeneous complex right adnexal mass and moderate hemoperitoneum. Appearance highly suspicious for right-sided ectopic pregnancy, although an obvious fetal pole or fetal cardiac activity outside of the uterus is not directly visualized. Critical Value/emergent results were called by telephone at the time of interpretation on 06/25/2017 at 8:17 am to [place], [place], who verbally acknowledged these results. Electronically Signed   By: Van Clines M.D.   On: 06/25/2017 08:24    Pending Labs Unresulted Labs (From admission, onward)   None      Vitals/Pain Today's Vitals   06/25/17 0530 06/25/17 0630 06/25/17 0709 06/25/17 0834  BP: (!) 109/55 118/66 116/66 114/61  Pulse: 66 (!) 58 62 65  Resp: 16 18 16 16   Temp:   99.1 F (37.3 C) 97.9 F (36.6 C)  TempSrc:   Oral Oral  SpO2: 100% 100% 100% 100%  PainSc:   8  8     Isolation Precautions No active isolations  Medications Medications  sodium chloride 0.9 % bolus 1,000 mL (0 mLs Intravenous Stopped 06/25/17 0549)  famotidine (PEPCID) IVPB 20 mg premix (0 mg Intravenous Stopped 06/25/17 0328)  ondansetron (ZOFRAN) injection 4 mg (4 mg Intravenous Given 06/25/17 0145)  acetaminophen (TYLENOL) tablet 650 mg (650 mg Oral Given 06/25/17 0646)    Mobility walks

## 2017-06-25 NOTE — Addendum Note (Signed)
Addendum  created 06/25/17 1604 by Trevor IhaHouser, Kaoir Loree A, MD   Order list changed, Order sets accessed

## 2017-06-25 NOTE — Transfer of Care (Signed)
Immediate Anesthesia Transfer of Care Note  Patient: [name]  Procedure(s) Performed: DIAGNOSTIC LAPAROSCOPY WITH REMOVAL OF ECTOPIC PREGNANCY (N/A Abdomen) LAPAROSCOPIC UNILATERAL SALPINGECTOMY (Left Abdomen)  Patient Location: PACU  Anesthesia Type:General  Level of Consciousness: awake, alert , oriented, drowsy, patient cooperative and pateint uncooperative  Airway & Oxygen Therapy: Patient Spontanous Breathing and Patient connected to nasal cannula oxygen  Post-op Assessment: Report given to RN and Post -op Vital signs reviewed and stable  Post vital signs: Reviewed and stable  Last Vitals:  Vitals Value Taken Time  BP 101/52 06/25/2017 11:17 AM  Temp    Pulse 88 06/25/2017 11:21 AM  Resp 27 06/25/2017 11:21 AM  SpO2 90 % 06/25/2017 11:21 AM  Vitals shown include unvalidated device data.  Last Pain:  Vitals:   06/25/17 0944  TempSrc:   PainSc: 6          Complications: No apparent anesthesia complications

## 2017-06-25 NOTE — Discharge Instructions (Signed)
Ectopic Pregnancy An ectopic pregnancy is when the fertilized egg attaches (implants) outside the uterus. Most ectopic pregnancies occur in one of the tubes where eggs travel from the ovary to the uterus (fallopian tubes), but the implanting can occur in other locations. In rare cases, ectopic pregnancies occur on the ovary, intestine, pelvis, abdomen, or cervix. In an ectopic pregnancy, the fertilized egg does not have the ability to develop into a normal, healthy baby. A ruptured ectopic pregnancy is one in which tearing or bursting of a fallopian tube causes internal bleeding. Often, there is intense lower abdominal pain, and vaginal bleeding sometimes occurs. Having an ectopic pregnancy can be life-threatening. If this dangerous condition is not treated, it can lead to blood loss, shock, or even death. What are the causes? The most common cause of this condition is damage to one of the fallopian tubes. A fallopian tube may be narrowed or blocked, and that keeps the fertilized egg from reaching the uterus. What increases the risk? This condition is more likely to develop in women of childbearing age who have different levels of risk. The levels of risk can be divided into three categories. High risk  You have gone through infertility treatment.  You have had an ectopic pregnancy before.  You have had surgery on the fallopian tubes, or another surgical procedure, such as an abortion.  You have had surgery to have the fallopian tubes tied (tubal ligation).  You have problems or diseases of the fallopian tubes.  You have been exposed to diethylstilbestrol (DES). This medicine was used until 1971, and it had effects on babies whose mothers took the medicine.  You become pregnant while using an IUD (intrauterine device) for birth control. Moderate risk  You have a history of infertility.  You have had an STI (sexually transmitted infection).  You have a history of pelvic inflammatory  disease (PID).  You have scarring from endometriosis.  You have multiple sexual partners.  You smoke. Low risk  You have had pelvic surgery.  You use vaginal douches.  You became sexually active before age 33. What are the signs or symptoms? Common symptoms of this condition include normal pregnancy symptoms, such as missing a period, nausea, tiredness, abdominal pain, breast tenderness, and bleeding. However, ectopic pregnancy will have additional symptoms, such as:  Pain with intercourse.  Irregular vaginal bleeding or spotting.  Cramping or pain on one side or in the lower abdomen.  Fast heartbeat, low blood pressure, and sweating.  Passing out while having a bowel movement.  Symptoms of a ruptured ectopic pregnancy and internal bleeding may include:  Sudden, severe pain in the abdomen and pelvis.  Dizziness, weakness, light-headedness, or fainting.  Pain in the shoulder or neck area.  How is this diagnosed? This condition is diagnosed by:  A pelvic exam to locate pain or a mass in the abdomen.  A pregnancy test. This blood test checks for the presence as well as the specific level of pregnancy hormone in the bloodstream.  Ultrasound. This is performed if a pregnancy test is positive. In this test, a probe is inserted into the vagina. The probe will detect a fetus, possibly in a location other than the uterus.  Taking a sample of uterus tissue (dilation and curettage, or D&C).  Surgery to perform a visual exam of the inside of the abdomen using a thin, lighted tube that has a tiny camera on the end (laparoscope).  Culdocentesis. This procedure involves inserting a needle at the top  of the vagina, behind the uterus. If blood is present in this area, it may indicate that a fallopian tube is torn.  How is this treated? This condition is treated with medicine or surgery. Medicine  An injection of a medicine (methotrexate) may be given to cause the pregnancy tissue  to be absorbed. This medicine may save your fallopian tube. It may be given if: ? The diagnosis is made early, with no signs of active bleeding. ? The fallopian tube has not ruptured. ? You are considered to be a good candidate for the medicine. Usually, pregnancy hormone blood levels are checked after methotrexate treatment. This is to be sure that the medicine is effective. It may take 4-6 weeks for the pregnancy to be absorbed. Most pregnancies will be absorbed by 3 weeks. Surgery  A laparoscope may be used to remove the pregnancy tissue.  If severe internal bleeding occurs, a larger cut (incision) may be made in the lower abdomen (laparotomy) to remove the fetus and placenta. This is done to stop the bleeding.  Part or all of the fallopian tube may be removed (salpingectomy) along with the fetus and placenta. The fallopian tube may also be repaired during the surgery.  In very rare circumstances, removal of the uterus (hysterectomy) may be required.  After surgery, pregnancy hormone testing may be done to be sure that there is no pregnancy tissue left. Whether your treatment is medicine or surgery, you may receive a Rho (D) immune globulin shot to prevent problems with any future pregnancy. This shot may be given if:  You are Rh-negative and the baby's father is Rh-positive.  You are Rh-negative and you do not know the Rh type of the baby's father.  Follow these instructions at home:  Rest and limit your activity after the procedure for as long as told by your health care provider.  Until your health care provider says that it is safe: ? Do not lift anything that is heavier than 10 lb (4.5 kg), or the limit that your health care provider tells you. ? Avoid physical exercise and any movement that requires effort (is strenuous).  To help prevent constipation: ? Eat a healthy diet that includes fruits, vegetables, and whole grains. ? Drink 6-8 glasses of water per day. Get help  right away if:  You develop worsening pain that is not relieved by medicine.  You have: ? A fever or chills. ? Vaginal bleeding. ? Redness and swelling at the incision site. ? Nausea and vomiting.  You feel dizzy or weak.  You feel light-headed or you faint. This information is not intended to replace advice given to you by your health care provider. Make sure you discuss any questions you have with your health care provider. Document Released: 04/30/2004 Document Revised: 11/20/2015 Document Reviewed: 10/23/2015 Elsevier Interactive Patient Education  2018 [employer]    DISCHARGE INSTRUCTIONS: Laparoscopy  The following instructions have been prepared to help you care for yourself upon your return home today.  Wound care:  Do not get the incision wet for the first 24 hours. The incision should be kept clean and dry.  The Band-Aids or dressings may be removed the day after surgery.  Should the incision become sore, red, and swollen after the first week, check with your doctor.  Personal hygiene:  Shower the day after your procedure.  Activity and limitations:  Do NOT drive or operate any equipment today.  Do NOT lift anything more than 15 pounds for 2-3  weeks after surgery. °• Do NOT rest in bed all day. °• Walking is encouraged. Walk each day, starting slowly with 5-minute walks 3 or 4 times a day. Slowly increase the length of your walks. °• Walk up and down stairs slowly. °• Do NOT do strenuous activities, such as golfing, playing tennis, bowling, running, biking, weight lifting, gardening, mowing, or vacuuming for 2-4 weeks. Ask your doctor when it is okay to start. ° °Diet: Eat a light meal as desired this evening. You may resume your usual diet tomorrow. ° °Return to work: This is dependent on the type of work you do. For the most part you can return to a desk job within a week of surgery. If you are more active at work, please discuss this with your doctor. ° °What  to expect after your surgery: You may have a slight burning sensation when you urinate on the first day. You may have a very small amount of blood in the urine. Expect to have a small amount of vaginal discharge/light bleeding for 1-2 weeks. It is not unusual to have abdominal soreness and bruising for up to 2 weeks. You may be tired and need more rest for about 1 week. You may experience shoulder pain for 24-72 hours. Lying flat in bed may relieve it. ° °Call your doctor for any of the following: °• Develop a fever of 100.4 or greater °• Inability to urinate 6 hours after discharge from hospital °• Severe pain not relieved by pain medications °• Persistent of heavy bleeding at incision site °• Redness or swelling around incision site after a week °• Increasing nausea or vomiting ° °Patient Signature________________________________________ °Nurse Signature_________________________________________ ° ° ° °Post Anesthesia Home Care Instructions ° °Activity: °Get plenty of rest for the remainder of the day. A responsible individual must stay with you for 24 hours following the procedure.  °For the next 24 hours, DO NOT: °-Drive a car °-Operate machinery °-Drink alcoholic beverages °-Take any medication unless instructed by your physician °-Make any legal decisions or sign important papers. ° °Meals: °Start with liquid foods such as gelatin or soup. Progress to regular foods as tolerated. Avoid greasy, spicy, heavy foods. If nausea and/or vomiting occur, drink only clear liquids until the nausea and/or vomiting subsides. Call your physician if vomiting continues. ° °Special Instructions/Symptoms: °Your throat may feel dry or sore from the anesthesia or the breathing tube placed in your throat during surgery. If this causes discomfort, gargle with warm salt water. The discomfort should disappear within 24 hours. ° °If you had a scopolamine patch placed behind your ear for the management of post- operative nausea and/or  vomiting: ° °1. The medication in the patch is effective for 72 hours, after which it should be removed.  Wrap patch in a tissue and discard in the trash. Wash hands thoroughly with soap and water. °2. You may remove the patch earlier than 72 hours if you experience unpleasant side effects which may include dry mouth, dizziness or visual disturbances. °3. Avoid touching the patch. Wash your hands with soap and water after contact with the patch. °  ° ° ° ° ° °

## 2017-06-25 NOTE — ED Notes (Signed)
Pt. Made aware for the need of urine specimen. 

## 2017-06-25 NOTE — H&P (Signed)
[name] is an 32 y.o. female [phone] transferred from [name] for the management of a ruptured ectopic pregnancy. She reports [name] lower abdominal pain and nausea and emesis since yesterday evening. Patient with history of polysubstance abuse and schizophrenia. UDS positive for cocaine. She admits to cocaine use on 3/20.    Past Medical History:  Diagnosis Date  . Asthma   . Current smoker   . Gonorrhea affecting pregnancy in second trimester, antepartum 11/07/2013   +GC 8/315  Notified 8/4, appt to come in for rocephin, azithromycin rx sent in       POC: negative 8/26   . Polysubstance abuse (HCC)   . Schizophrenia [facility])     Past Surgical History:  Procedure Laterality Date  . BREAST ENHANCEMENT SURGERY    . breat implants    . DILATION AND CURETTAGE OF UTERUS N/A 10/30/2016   Procedure: DILATATION AND CURETTAGE;  Surgeon: Hermina Staggers, MD;  Location: [facility];  Service: Gynecology;  Laterality: N/A;    History reviewed. No pertinent family history.  Social History:  reports that she has been smoking cigarettes.  She has been smoking about 0.10 packs per day. She has never used smokeless tobacco. She reports that she drinks alcohol. She reports that she does not use drugs.  Allergies:  Allergies  Allergen Reactions  . Penicillins Hives    Has patient had a PCN reaction causing immediate rash, facial/tongue/throat swelling, SOB or lightheadedness with hypotension: Yes Has patient had a PCN reaction causing severe rash involving mucus membranes or skin necrosis: No Has patient had a PCN reaction that required hospitalization: No Has patient had a PCN reaction occurring within the last 10 years: No If all of the above answers are "NO", then may proceed with Cephalosporin use.     Medications Prior to Admission  Medication Sig Dispense Refill Last Dose  . naproxen sodium (ALEVE) 220 MG tablet Take 220 mg by mouth 2 (two) times daily as needed  (pain).   Past Month at Unknown time  . tetrahydrozoline 0.05 % ophthalmic solution Place 1 drop into both eyes 3 (three) times daily as needed (dry eyes).   Past Week at Unknown time  . albuterol (PROVENTIL HFA;VENTOLIN HFA) 108 (90 Base) MCG/ACT inhaler Inhale 1-2 puffs into the lungs every 6 (six) hours as needed for wheezing or shortness of breath.   unknown    ROS  See pertinent in HPI  Blood pressure (!) 125/53, pulse 66, temperature 98.8 F (37.1 C), temperature source Axillary, resp. rate 16, SpO2 100 %, unknown if currently breastfeeding. Physical Exam GENERAL: Well-developed, well-nourished female in no acute distress.  LUNGS: Clear to auscultation bilaterally.  HEART: Regular rate and rhythm. ABDOMEN: Soft, tenderness to palpation, nondistended. No rebound, no guarding PELVIC: Normal external female genitalia. Vagina is pink and rugated.  Normal discharge. Normal appearing cervix. Uterus is normal in size. No adnexal mass or tenderness. EXTREMITIES: No cyanosis, clubbing, or edema, 2+ distal pulses.  Results for orders placed or performed during the hospital encounter of 06/25/17 (from the past 24 hour(s))  CBC with Differential     Status: Abnormal   Collection Time: 06/25/17  1:44 AM  Result Value Ref Range   WBC 11.9 (H) 4.0 - 10.5 K/uL   RBC 3.55 (L) 3.87 - 5.11 MIL/uL   Hemoglobin 11.0 (L) 12.0 - 15.0 g/dL   HCT 14.7 (L) 82.9 - 56.2 %   MCV 93.2 78.0 - 100.0 fL  MCH 31.0 26.0 - 34.0 pg   MCHC 33.2 30.0 - 36.0 g/dL   RDW 11.914.2 14.711.5 - 82.915.5 %   Platelets 267 150 - 400 K/uL   Neutrophils Relative % 77 %   Neutro Abs 9.1 (H) 1.7 - 7.7 K/uL   Lymphocytes Relative 18 %   Lymphs Abs 2.1 0.7 - 4.0 K/uL   Monocytes Relative 5 %   Monocytes Absolute 0.6 0.1 - 1.0 K/uL   Eosinophils Relative 0 %   Eosinophils Absolute 0.0 0.0 - 0.7 K/uL   Basophils Relative 0 %   Basophils Absolute 0.0 0.0 - 0.1 K/uL  Lipase, blood     Status: None   Collection Time: 06/25/17  1:44 AM   Result Value Ref Range   Lipase 19 11 - 51 U/L  Comprehensive metabolic panel     Status: Abnormal   Collection Time: 06/25/17  1:44 AM  Result Value Ref Range   Sodium 134 (L) 135 - 145 mmol/L   Potassium 4.0 3.5 - 5.1 mmol/L   Chloride 104 101 - 111 mmol/L   CO2 22 22 - 32 mmol/L   Glucose, Bld 109 (H) 65 - 99 mg/dL   BUN 12 6 - 20 mg/dL   Creatinine, Ser 5.620.76 0.44 - 1.00 mg/dL   Calcium 8.4 (L) 8.9 - 10.3 mg/dL   Total Protein 6.4 (L) 6.5 - 8.1 g/dL   Albumin 3.4 (L) 3.5 - 5.0 g/dL   AST 18 15 - 41 U/L   ALT 10 (L) 14 - 54 U/L   Alkaline Phosphatase 32 (L) 38 - 126 U/L   Total Bilirubin 0.2 (L) 0.3 - 1.2 mg/dL   GFR calc non Af Amer >60 >60 mL/min   GFR calc Af Amer >60 >60 mL/min   Anion gap 8 5 - 15  Ethanol     Status: None   Collection Time: 06/25/17  1:44 AM  Result Value Ref Range   Alcohol, Ethyl (B) <10 <10 mg/dL  hCG, quantitative, pregnancy     Status: Abnormal   Collection Time: 06/25/17  1:44 AM  Result Value Ref Range   hCG, Beta Chain, Quant, S 47,829 (H) <5 mIU/mL  Wet prep, genital     Status: Abnormal   Collection Time: 06/25/17  5:47 AM  Result Value Ref Range   Yeast Wet Prep HPF POC NONE SEEN NONE SEEN   Trich, Wet Prep NONE SEEN NONE SEEN   Clue Cells Wet Prep HPF POC PRESENT (A) NONE SEEN   WBC, Wet Prep HPF POC MANY (A) NONE SEEN   Sperm NONE SEEN   Urinalysis, Routine w reflex microscopic     Status: Abnormal   Collection Time: 06/25/17  5:50 AM  Result Value Ref Range   Color, Urine YELLOW YELLOW   APPearance CLEAR CLEAR   Specific Gravity, Urine 1.026 1.005 - 1.030   pH 5.0 5.0 - 8.0   Glucose, UA NEGATIVE NEGATIVE mg/dL   Hgb urine dipstick SMALL (A) NEGATIVE   Bilirubin Urine NEGATIVE NEGATIVE   Ketones, ur 20 (A) NEGATIVE mg/dL   Protein, ur NEGATIVE NEGATIVE mg/dL   Nitrite NEGATIVE NEGATIVE   Leukocytes, UA NEGATIVE NEGATIVE   RBC / HPF 0-5 0 - 5 RBC/hpf   WBC, UA 0-5 0 - 5 WBC/hpf   Bacteria, UA RARE (A) NONE SEEN   Squamous  Epithelial / LPF 0-5 (A) NONE SEEN   Mucus PRESENT   Pregnancy, urine     Status: Abnormal  Collection Time: 06/25/17  5:50 AM  Result Value Ref Range   Preg Test, Ur POSITIVE (A) NEGATIVE  Rapid urine drug screen (hospital performed)     Status: Abnormal   Collection Time: 06/25/17  5:50 AM  Result Value Ref Range   Opiates NONE DETECTED NONE DETECTED   Cocaine POSITIVE (A) NONE DETECTED   Benzodiazepines NONE DETECTED NONE DETECTED   Amphetamines NONE DETECTED NONE DETECTED   Tetrahydrocannabinol POSITIVE (A) NONE DETECTED   Barbiturates NONE DETECTED NONE DETECTED    US Ob Comp Less 14 Wks  Result Date: 06/25/2017 CLINICAL DATA:  Pelvic pain, early pregnancy, quantitative beta HCG 47,829 EXAM: OBSTETRIC <14 WK [place] AND TRANSVAGINAL OB US TECHNIQUE: Both transabdominal and transvaginal ultrasound examinations were performed for complete evaluation of the gestation as well as the maternal uterus, adnexal regions, and pelvic cul-de-sac. Transvaginal technique was performed to assess early pregnancy. COMPARISON:  10/29/2016 FINDINGS: Intrauterine gestational sac: Absent Yolk sac:  Absent Embryo:  Absent Cardiac Activity: Absent Heart Rate: N/A  bpm Subchorionic hemorrhage:  None visualized. Maternal uterus/adnexae: Heterogeneous right adnexal mass 6.8 by 4.1 by 2.1 cm. Hemoperitoneum noted in the cul-de-sac, moderate in size. No recognizable fetal cardiac activity or recognizable gestational sac is identified in the right adnexa. IMPRESSION: 1. Lack of intrauterine gestational sac with a heterogeneous complex right adnexal mass and moderate hemoperitoneum. Appearance highly suspicious for right-sided ectopic pregnancy, although an obvious fetal pole or fetal cardiac activity outside of the uterus is not directly visualized. Critical Value/emergent results were called by telephone at the time of interpretation on 06/25/2017 at 8:17 am to [place], [place], who verbally acknowledged these results.  Electronically Signed   By: Gaylyn Rong M.D.   On: 06/25/2017 08:24   US Ob Transvaginal  Result Date: 06/25/2017 CLINICAL DATA:  Pelvic pain, early pregnancy, quantitative beta HCG 47,829 EXAM: OBSTETRIC <14 WK [place] AND TRANSVAGINAL OB US TECHNIQUE: Both transabdominal and transvaginal ultrasound examinations were performed for complete evaluation of the gestation as well as the maternal uterus, adnexal regions, and pelvic cul-de-sac. Transvaginal technique was performed to assess early pregnancy. COMPARISON:  10/29/2016 FINDINGS: Intrauterine gestational sac: Absent Yolk sac:  Absent Embryo:  Absent Cardiac Activity: Absent Heart Rate: N/A  bpm Subchorionic hemorrhage:  None visualized. Maternal uterus/adnexae: Heterogeneous right adnexal mass 6.8 by 4.1 by 2.1 cm. Hemoperitoneum noted in the cul-de-sac, moderate in size. No recognizable fetal cardiac activity or recognizable gestational sac is identified in the right adnexa. IMPRESSION: 1. Lack of intrauterine gestational sac with a heterogeneous complex right adnexal mass and moderate hemoperitoneum. Appearance highly suspicious for right-sided ectopic pregnancy, although an obvious fetal pole or fetal cardiac activity outside of the uterus is not directly visualized. Critical Value/emergent results were called by telephone at the time of interpretation on 06/25/2017 at 8:17 am to [place], [place], who verbally acknowledged these results. Electronically Signed   By: Gaylyn Rong M.D.   On: 06/25/2017 08:24    Assessment/Plan: 32 yo with ultrasound findings suspicious for a ruptured ectopic pregnancy - Discussed surgical management with a laparoscopic salpingectomy. Risks, benefits and alternatives were explained including but not limited to risks of bleeding, infection and damage to adjacent organs. Patient verbalized understanding and all questions were answered  Jawuan Robb 06/25/2017, 9:40 AM

## 2017-06-25 NOTE — Progress Notes (Signed)
Patient ID: Sandra ComingsJessica V Price, female   DOB: November 15, 1985, 32 y.o.   MRN: [id number]  Called to evaluate patient for suspected hematoma near RLQ port.  Approx 5 x 5 cm area marked on abdomen.  CBC drawn.  Will re-evaluate in 30 ins to 60 mins and see if stable for discharge.  Dr. Jolayne Pantheronstant aware.

## 2017-06-25 NOTE — Anesthesia Preprocedure Evaluation (Addendum)
Anesthesia Evaluation  Patient identified by MRN, date of birth, ID band Patient unresponsive    Reviewed: Patient's Chart, lab work & pertinent test results, Unable to perform ROS - Chart review onlyPreop documentation limited or incomplete due to emergent nature of procedure.  Airway Mallampati: II   Neck ROM: Full    Dental  (+) Teeth Intact   Pulmonary Current Smoker,    Pulmonary exam normal        Cardiovascular Normal cardiovascular exam     Neuro/Psych Schizophrenia    GI/Hepatic (+)     substance abuse  alcohol use, cocaine use and marijuana use, By toxicity screen   Endo/Other    Renal/GU      Musculoskeletal   Abdominal   Peds  Hematology   Anesthesia Other Findings   Reproductive/Obstetrics                            Lab Results  Component Value Date   WBC 11.9 (H) 06/25/2017   HGB 11.0 (L) 06/25/2017   HCT 33.1 (L) 06/25/2017   MCV 93.2 06/25/2017   PLT 267 06/25/2017   Lab Results  Component Value Date   CREATININE 0.76 06/25/2017   BUN 12 06/25/2017   NA 134 (L) 06/25/2017   K 4.0 06/25/2017   CL 104 06/25/2017   CO2 22 06/25/2017    Anesthesia Physical Anesthesia Plan  ASA: III and emergent  Anesthesia Plan:    Post-op Pain Management:    Induction: Intravenous, Rapid sequence and Cricoid pressure planned  PONV Risk Score and Plan:   Airway Management Planned: Oral ETT and Video Laryngoscope Planned  Additional Equipment:   Intra-op Plan:   Post-operative Plan: Possible Post-op intubation/ventilation  Informed Consent:   Only emergency history available and History available from chart only  Plan Discussed with: CRNA and Anesthesiologist  Anesthesia Plan Comments:         Anesthesia Quick Evaluation

## 2017-06-25 NOTE — Op Note (Signed)
Johnell ComingsJessica V Kasler PROCEDURE DATE: 06/25/2017  PREOPERATIVE DIAGNOSIS: Ruptured ectopic pregnancy POSTOPERATIVE DIAGNOSIS: Ruptured left fallopian tube ectopic pregnancy PROCEDURE: Laparoscopic left salpingectomy and removal of ectopic pregnancy SURGEON:  Dr. Catalina AntiguaPeggy Yanisa Goodgame ASSISTANT: none ANESTHESIOLOGIST: Trevor IhaHouser, Stephen A, MD Anesthesiologist: Trevor IhaHouser, Stephen A, MD CRNA: Yolonda Kidaarver, Alison L, CRNA  INDICATIONS: 32 y.o. [id number] at 1154w6d here for with ruptured ectopic pregnancy. On exam, she had stable vital signs, and an acute abdomen. Hgb  Lab Results  Component Value Date   HGB 9.1 (L) 06/25/2017   , blood type O POS . Patient was counseled regarding need for laparoscopic salpingectomy. Risks of surgery including bleeding which may require transfusion or reoperation, infection, injury to bowel or other surrounding organs, need for additional procedures including laparotomy and other postoperative/anesthesia complications were explained to patient.  Written informed consent was obtained.  FINDINGS:  large amount of hemoperitoneum estimated to be about 1000 of blood and clots.  Dilated left fallopian tube containing ectopic gestation and approximately 9-10 week fetus attached by umbilical cord visualized adjacent to left tube. Small normal appearing uterus, normal left fallopian tube, right ovary and right ovary.  ANESTHESIA: General INTRAVENOUS FLUIDS: .2000 ml ESTIMATED BLOOD LOSS:  1000 mL  URINE OUTPUT: 400 ml SPECIMENS: left fallopian tube containing ectopic gestation COMPLICATIONS: None immediate  PROCEDURE IN DETAIL:  The patient was taken to the operating room where general anesthesia was administered and was found to be adequate.  She was placed in the dorsal lithotomy position, and was prepped and draped in a sterile manner.  A Foley catheter was inserted into her bladder and attached to Charelle Petrakis drainage and a uterine manipulator was then advanced into the uterus .  After an  adequate timeout was performed, attention was then turned to the patient's abdomen where a 10-mm skin incision was made on the umbilical fold.  The fascia was identified, tented up and incised with Mayo scissors. The fascia was tagged with 0- Vicryl. The peritoneum was identified tented up and entered sharply with Metzenbaum scissors.  10-mm trocar and sleeve were then advanced without difficulty into the abdomen where intraabdominal placement was confirmed by the laparoscope. Pneumoperitoneum was achieved with insufflation of CO2 gas. A survey of the patient's pelvis and abdomen revealed the findings as above.  Two right lower quadrant ports were placed under direct visualization; 5-mm 2 cm above and medial to the superior iliac spine and the other 5 cm cephalad from the first.  The 5-mm Nezhat suction irrigator was then used to suction the hemoperitoneum and irrigate the pelvis.  Attention was then turned to the left fallopian tube which was grasped and ligated from the underlying mesosalpinx and uterine attachment using the Enseal instrument.  Good hemostasis was noted.  The specimen was placed in an EndoCatch bag and removed from the abdomen intact.  To ensure persistence of hemostasis, Arista was applied over the left adnexa region. The abdomen was desufflated, and all instruments were removed.  The fascial incisions of both 10-mm sites were reapproximated with 0 Vicryl figure-of-eight stiches; and all skin incisions were closed with a 3-0 Vicryl subcuticular stitch. The patient tolerated the procedures well.  All instruments, needles, and sponge counts were correct x 2. The patient was taken to the recovery room in stable condition.   The patient will be discharged to home as per PACU criteria.  Routine postoperative instructions given.  She was prescribed Percocet, Ibuprofen and Colace.  She will follow up in the clinic in 2 weeks for  postoperative evaluation .

## 2017-06-25 NOTE — ED Provider Notes (Signed)
Physical Exam  BP (!) 109/55   Pulse 66   Resp 16   SpO2 100%    Assumed care from Antony Madura, PA-C at 7:20 AM. Briefly, the patient is a 32 y.o. female who  has a past medical history of Asthma, Current smoker, Gonorrhea affecting pregnancy in second trimester, antepartum (11/07/2013), Polysubstance abuse (HCC), and Schizophrenia (HCC). here with lower abdominal pain, emesis.  Patient found to be pregnant on routine workup.  This was an unexpected pregnancy.  Patient also presenting with pelvic pain, but without vaginal discharge.  Patient does have a history of incomplete miscarriage requiring D and C.   Workup thus far, patient found to be hemodynamically stable, hemoglobin of 11.0, hcg quant of 47,829 and UDS is positive for cocaine and cannabis.  Labs Reviewed  CBC WITH DIFFERENTIAL/PLATELET - Abnormal; Notable for the following components:      Result Value   WBC 11.9 (*)    RBC 3.55 (*)    Hemoglobin 11.0 (*)    HCT 33.1 (*)    Neutro Abs 9.1 (*)    All other components within normal limits  URINALYSIS, ROUTINE W REFLEX MICROSCOPIC - Abnormal; Notable for the following components:   Hgb urine dipstick SMALL (*)    Ketones, ur 20 (*)    Bacteria, UA RARE (*)    Squamous Epithelial / LPF 0-5 (*)    All other components within normal limits  PREGNANCY, URINE - Abnormal; Notable for the following components:   Preg Test, Ur POSITIVE (*)    All other components within normal limits  COMPREHENSIVE METABOLIC PANEL - Abnormal; Notable for the following components:   Sodium 134 (*)    Glucose, Bld 109 (*)    Calcium 8.4 (*)    Total Protein 6.4 (*)    Albumin 3.4 (*)    ALT 10 (*)    Alkaline Phosphatase 32 (*)    Total Bilirubin 0.2 (*)    All other components within normal limits  RAPID URINE DRUG SCREEN, HOSP PERFORMED - Abnormal; Notable for the following components:   Cocaine POSITIVE (*)    Tetrahydrocannabinol POSITIVE (*)    All other components within normal limits   WET PREP, GENITAL  LIPASE, BLOOD  ETHANOL  HCG, QUANTITATIVE, PREGNANCY  GC/CHLAMYDIA PROBE AMP ([place]) NOT AT [facility]    Course of Care:   Physical Exam  Constitutional: She appears well-developed and well-nourished. No distress.  Somnolent but easily arousable and responsive.  HENT:  Head: Normocephalic and atraumatic.  Eyes: Conjunctivae are normal. Right eye exhibits no discharge. Left eye exhibits no discharge.  EOMs normal to gross examination.  Neck: Normal range of motion.  Cardiovascular: Normal rate and regular rhythm.  Nontachycardic.  Pulmonary/Chest:  Normal respiratory effort. Patient converses comfortably. No audible wheeze or stridor.  Abdominal: She exhibits no distension. There is tenderness.  Diffuse tenderness to the abdomen to light palpation.  Musculoskeletal: Normal range of motion.  Neurological: She is alert.  Cranial nerves intact to gross observation. Patient moves extremities without difficulty.  Skin: Skin is warm and dry. She is not diaphoretic.  Psychiatric: She has a normal mood and affect. Her behavior is normal. Judgment and thought content normal.  Nursing note and vitals reviewed.   ED Course/Procedures     Procedures  MDM   Plan discussed with Antony Madura, PA-C.  Per PA-C Humes, no cervical motion tenderness, adnexal tenderness, or cervical friability.  Low suspicion for PID.  If patient has  abnormality on pelvic ultrasound, will consult OB.  Patient does exhibit clue cells and many WBCs on wet prep.  Will treat for bacterial vaginosis and empirically for STI.  7:56 AM patient seen and examined.  Patient is difficult to arouse.  Patient is responsive to pain, as she was immediately awoken and responsive verbally with palpation of the abdomen.  Spoke with ultrasound technician, who noted that there was free fluid concerning for ruptured ectopic, and awaiting read by radiologist for final result.  Patient is hemodynamically stable at  this time with blood pressure of 109 systolic, and pulse 73.  8:15 AM Spoke with Dr[facility] Karle StarchLukens of [facility]. >95% chance of ectopic in right adnexa. No definite fetal heartbeat and complex fluid in cul de sac concerning for rupture. Will call OBGYN at this time.  8:23 AM Spoke with Dr. Earlene Plateravis, faculty physician for [facility], who will speak with her colleagues regarding who is [facility] and further establish the management of this patient's ectopic pregnancy.  8:25 AM Spoke with Dr. Earlene Plateravis who states that the patient will be [facility] to [facility], [facility], accepting physician Dr. Catalina AntiguaPeggy Constant.    [name], [name], PA-C 06/25/17 0910    Paula LibraMolpus, John, MD 06/25/17 2235

## 2017-06-25 NOTE — Anesthesia Postprocedure Evaluation (Signed)
Anesthesia Post Note  Patient: [name]  Procedure(s) Performed: DIAGNOSTIC LAPAROSCOPY WITH REMOVAL OF ECTOPIC PREGNANCY (N/A Abdomen) LAPAROSCOPIC UNILATERAL SALPINGECTOMY (Left Abdomen)     Patient location during evaluation: PACU Anesthesia Type: General Level of consciousness: awake and alert Pain management: pain level controlled Vital Signs Assessment: post-procedure vital signs reviewed and stable Respiratory status: spontaneous breathing, nonlabored ventilation, respiratory function stable and patient connected to nasal cannula oxygen Cardiovascular status: blood pressure returned to baseline and stable Postop Assessment: no apparent nausea or vomiting Anesthetic complications: no    Last Vitals:  Vitals:   06/25/17 1300 06/25/17 1315  BP: 98/62   Pulse: 75 62  Resp: 20 10  Temp:    SpO2: 100% 100%    Last Pain:  Vitals:   06/25/17 1315  TempSrc:   PainSc: Asleep   Pain Goal:                 Tanga Gloor A Branndon Tuite     

## 2017-06-25 NOTE — MAU Note (Signed)
Pt arrived EMS. Handoff to charge nurse. Taken over pt care. Pt not answering questions and not responding. Pt heard snoring. Woke up to say "you're not cutting open my stomach, are you". I responded that we're trying to talk to her about it and she falls back to sleep.

## 2017-06-25 NOTE — ED Notes (Signed)
Pt is able to tolerate fluids and saltines.

## 2017-06-25 NOTE — Anesthesia Postprocedure Evaluation (Signed)
Anesthesia Post Note  Patient: Sandra ComingsJessica V Price  Procedure(s) Performed: DIAGNOSTIC LAPAROSCOPY WITH REMOVAL OF ECTOPIC PREGNANCY (N/A Abdomen) LAPAROSCOPIC UNILATERAL SALPINGECTOMY (Left Abdomen)     Patient location during evaluation: PACU Anesthesia Type: General Level of consciousness: awake and alert Pain management: pain level controlled Vital Signs Assessment: post-procedure vital signs reviewed and stable Respiratory status: spontaneous breathing, nonlabored ventilation, respiratory function stable and patient connected to nasal cannula oxygen Cardiovascular status: blood pressure returned to baseline and stable Postop Assessment: no apparent nausea or vomiting Anesthetic complications: no    Last Vitals:  Vitals:   06/25/17 1300 06/25/17 1315  BP: 98/62   Pulse: 75 62  Resp: 20 10  Temp:    SpO2: 100% 100%    Last Pain:  Vitals:   06/25/17 1315  TempSrc:   PainSc: Asleep   Pain Goal:                 Trevor IhaStephen A Houser

## 2017-06-25 NOTE — Addendum Note (Signed)
Addendum  created 06/25/17 1325 by Trevor IhaHouser, Ossie Beltran A, MD   Sign clinical note

## 2017-06-25 NOTE — ED Notes (Addendum)
Pt is uncooperative with this RNs questions. Pt is only vocal when getting stuck for an IV. Numerous attempts were made to assess pt and pt would not answer questions and randomly fall asleep snoring.  Only information that was able to be obtained is that the pt is in pain in lower abdomen and is cold and wants to sleep. Pt made aware of urine sample. She refused at this time.

## 2017-06-25 NOTE — MAU Note (Signed)
Dr. Jolayne Pantheronstant in room speaking to patient for consent. Patient waking up enough to talk to Dr. Jolayne Pantheronstant.

## 2017-06-25 NOTE — Anesthesia Procedure Notes (Signed)
Procedure Name: Intubation Date/Time: 06/25/2017 10:02 AM Performed by: Raenette Rover, CRNA Pre-anesthesia Checklist: Patient identified, Emergency Drugs available, Suction available and Patient being monitored Patient Re-evaluated:Patient Re-evaluated prior to induction Oxygen Delivery Method: Circle system utilized Preoxygenation: Pre-oxygenation with 100% oxygen Induction Type: IV induction, Cricoid Pressure applied and Rapid sequence Laryngoscope Size: Mac and 3 Grade View: Grade I Tube type: Oral Tube size: 7.0 mm Number of attempts: 1 Airway Equipment and Method: Stylet Placement Confirmation: ETT inserted through vocal cords under direct vision,  positive ETCO2,  CO2 detector and breath sounds checked- equal and bilateral Secured at: 22 cm Tube secured with: Tape Dental Injury: Teeth and Oropharynx as per pre-operative assessment

## 2017-06-26 ENCOUNTER — Encounter (HOSPITAL_COMMUNITY): Payer: Self-pay | Admitting: Obstetrics and Gynecology

## 2017-06-29 ENCOUNTER — Telehealth: Payer: Self-pay | Admitting: *Deleted

## 2017-06-29 NOTE — Telephone Encounter (Signed)
Attempted to call patient at the "[phone]" number given. The voice mail picked up and the recorded message was a female. I left a message stating I am looking for Sandra GamblerJessica Rijo, I am returning her call. Attempted calling all other listed numbers but was unable to get a response. Will try again later.

## 2017-06-29 NOTE — Telephone Encounter (Signed)
Patient returned call, left voice mail. Stated she is better now.

## 2017-06-29 NOTE — Telephone Encounter (Signed)
Patient left message on nurse line, stated her leg has swelled up and should she be worried. Please return her call.

## 2017-07-13 ENCOUNTER — Ambulatory Visit: Payer: Self-pay | Admitting: Obstetrics and Gynecology

## 2017-10-25 ENCOUNTER — Encounter (HOSPITAL_COMMUNITY): Payer: Self-pay | Admitting: *Deleted

## 2017-10-25 ENCOUNTER — Inpatient Hospital Stay (HOSPITAL_COMMUNITY)
Admission: AD | Admit: 2017-10-25 | Discharge: 2017-10-25 | Disposition: A | Discharge status: Home / Self Care | Payer: Self-pay | Source: Ambulatory Visit | Attending: Obstetrics and Gynecology | Admitting: Obstetrics and Gynecology

## 2017-10-25 DIAGNOSIS — B9689 Other specified bacterial agents as the cause of diseases classified elsewhere: Secondary | ICD-10-CM

## 2017-10-25 DIAGNOSIS — R1033 Periumbilical pain: Secondary | ICD-10-CM | POA: Insufficient documentation

## 2017-10-25 DIAGNOSIS — N760 Acute vaginitis: Secondary | ICD-10-CM | POA: Insufficient documentation

## 2017-10-25 DIAGNOSIS — Z3202 Encounter for pregnancy test, result negative: Secondary | ICD-10-CM | POA: Insufficient documentation

## 2017-10-25 DIAGNOSIS — Z880 Allergy status to penicillin: Secondary | ICD-10-CM | POA: Insufficient documentation

## 2017-10-25 DIAGNOSIS — F1410 Cocaine abuse, uncomplicated: Secondary | ICD-10-CM

## 2017-10-25 DIAGNOSIS — F17210 Nicotine dependence, cigarettes, uncomplicated: Secondary | ICD-10-CM | POA: Insufficient documentation

## 2017-10-25 DIAGNOSIS — F1910 Other psychoactive substance abuse, uncomplicated: Secondary | ICD-10-CM | POA: Insufficient documentation

## 2017-10-25 DIAGNOSIS — F1220 Cannabis dependence, uncomplicated: Secondary | ICD-10-CM

## 2017-10-25 LAB — WET PREP, GENITAL
Sperm: NONE SEEN
Trich, Wet Prep: NONE SEEN
Yeast Wet Prep HPF POC: NONE SEEN

## 2017-10-25 LAB — URINALYSIS, ROUTINE W REFLEX MICROSCOPIC
Bacteria, UA: NONE SEEN
Bilirubin Urine: NEGATIVE
GLUCOSE, UA: NEGATIVE mg/dL
Ketones, ur: NEGATIVE mg/dL
Leukocytes, UA: NEGATIVE
Nitrite: NEGATIVE
PH: 5 (ref 5.0–8.0)
Protein, ur: NEGATIVE mg/dL
SPECIFIC GRAVITY, URINE: 1.023 (ref 1.005–1.030)

## 2017-10-25 LAB — RAPID URINE DRUG SCREEN, HOSP PERFORMED
AMPHETAMINES: NOT DETECTED
BARBITURATES: NOT DETECTED
BENZODIAZEPINES: NOT DETECTED
COCAINE: POSITIVE — AB
Opiates: NOT DETECTED
TETRAHYDROCANNABINOL: POSITIVE — AB

## 2017-10-25 LAB — POCT PREGNANCY, URINE: Preg Test, Ur: NEGATIVE

## 2017-10-25 MED ORDER — METRONIDAZOLE 500 MG PO TABS: 500.0000 mg | ORAL_TABLET | Freq: Two times a day (BID) | ORAL | 0 refills | Status: DC

## 2017-10-25 NOTE — MAU Note (Signed)
Pt presents to MAU with complaints of pain in the left side of her abdomen. Reports it to be a stabbing pain. Diagnosed with an ectopic pregnancy in march and has not had a menstrual cycle since her ectopic

## 2017-10-25 NOTE — Discharge Instructions (Signed)

## 2017-10-25 NOTE — MAU Provider Note (Signed)
History     CSN: [id number]  Arrival date and time: 10/25/17 1045   First Provider Initiated Contact with Patient 10/25/17 1150      Chief Complaint  Patient presents with  . Abdominal Pain   HPI  [name] is a 32 y.o. [phone] non pregnant patient who presents to MAU with chief complaint of periumbilical pain. Onset two weeks ago, midline, suprapubic, rated 8/10, does not radiate, no aggravating or alleviating factors. Has not tried management with medication. Denies fever, recent illness, accident or fall. Denies other pain sites  Patient is s/p ruptured left ovarian ectopic pregnancy, laparoscopic left salpingectomy 06/25/17.  Pertinent Gynecological History: Menses: flow is moderate Bleeding: N/A Contraception: condoms DES exposure: denies Blood transfusions: none Sexually transmitted diseases: currently at risk Previous GYN Procedures: N/A  Last mammogram: N/A age 64  Last pap: normal Date: April 2019 per patient   Past Medical History:  Diagnosis Date  . Asthma   . Current smoker   . Gonorrhea affecting pregnancy in second trimester, antepartum 11/07/2013   +GC 8/315  Notified 8/4, appt to come in for rocephin, azithromycin rx sent in       POC: negative 8/26   . Polysubstance abuse (HCC)   . Schizophrenia [facility])     Past Surgical History:  Procedure Laterality Date  . BREAST ENHANCEMENT SURGERY    . breat implants    . DIAGNOSTIC LAPAROSCOPY WITH REMOVAL OF ECTOPIC PREGNANCY N/A 06/25/2017   Procedure: DIAGNOSTIC LAPAROSCOPY WITH REMOVAL OF ECTOPIC PREGNANCY;  Surgeon: Catalina Antigua, MD;  Location: [facility] ORS;  Service: Gynecology;  Laterality: N/A;  . DILATION AND CURETTAGE OF UTERUS N/A 10/30/2016   Procedure: DILATATION AND CURETTAGE;  Surgeon: Hermina Staggers, MD;  Location: [facility]IRTH[facility] SU[facility];  Service: Gynecology;  Laterality: N/A;  . LAPAROSCOPIC UNILATERAL SALPINGECTOMY Left 06/25/2017   Procedure: LAPAROSCOPIC UNILATERAL SALPINGECTOMY;  Surgeon:  Catalina Antigua, MD;  Location: [facility] ORS;  Service: Gynecology;  Laterality: Left;    History reviewed. No pertinent family history.  Social History   Tobacco Use  . Smoking status: Current Some Day Smoker    Packs/day: 0.10    Types: Cigarettes  . Smokeless tobacco: Never Used  Substance Use Topics  . Alcohol use: Yes  . Drug use: No    Types: Marijuana    Comment: Occasional, denies any use today,     Allergies:  Allergies  Allergen Reactions  . Penicillins Hives    Has patient had a PCN reaction causing immediate rash, facial/tongue/throat swelling, SOB or lightheadedness with hypotension: Yes Has patient had a PCN reaction causing severe rash involving mucus membranes or skin necrosis: No Has patient had a PCN reaction that required hospitalization: No Has patient had a PCN reaction occurring within the last 10 years: No If all of the above answers are "NO", then may proceed with Cephalosporin use.     Medications Prior to Admission  Medication Sig Dispense Refill Last Dose  . albuterol (PROVENTIL HFA;VENTOLIN HFA) 108 (90 Base) MCG/ACT inhaler Inhale 1-2 puffs into the lungs every 6 (six) hours as needed for wheezing or shortness of breath.   unknown  . docusate sodium (COLACE) 100 MG capsule Take 1 capsule (100 mg total) by mouth 2 (two) times daily as needed. 30 capsule 2   . ferrous sulfate (FERROUSUL) 325 (65 FE) MG tablet Take 1 tablet (325 mg total) by mouth 2 (two) times daily. 60 tablet 1   . ibuprofen (ADVIL,MOTRIN) 600 MG tablet Take  1 tablet (600 mg total) by mouth every 6 (six) hours as needed. 60 tablet 3   . oxyCODONE-acetaminophen (PERCOCET/ROXICET) 5-325 MG tablet Take 1-2 tablets by mouth every 6 (six) hours as needed. 20 tablet 0   . tetrahydrozoline 0.05 % ophthalmic solution Place 1 drop into both eyes 3 (three) times daily as needed (dry eyes).   Past Week at Unknown time    Review of Systems  Constitutional: Negative for fatigue and fever.   Gastrointestinal: Positive for abdominal pain. Negative for nausea and vomiting.  Genitourinary: Negative for difficulty urinating, pelvic pain, vaginal bleeding, vaginal discharge and vaginal pain.  Neurological: Negative for headaches.  All other systems reviewed and are negative.  Physical Exam   Blood pressure 110/68, pulse (!) 52, temperature 97.6 F (36.4 C), resp. rate 16, height 5\' 9"  (1.753 m), weight 136 lb (61.7 kg), last menstrual period 06/25/2017, SpO2 100 %, unknown if currently breastfeeding.  Physical Exam  Nursing note and vitals reviewed. Constitutional: She is oriented to person, place, and time. She appears well-developed and well-nourished.  Cardiovascular: Normal rate, regular rhythm, normal heart sounds and intact distal pulses.  Respiratory: Effort normal and breath sounds normal.  GI: Soft. Bowel sounds are normal. She exhibits no distension and no mass. There is no tenderness. There is no rebound and no guarding.  Genitourinary: Vagina normal and uterus normal.  Neurological: She is alert and oriented to person, place, and time. She has normal reflexes.  Skin: Skin is warm and dry.  Psychiatric: She has a normal mood and affect. Her behavior is normal. Judgment and thought content normal.   Patient sound asleep upon entry to room. Unable to wake patient by introducing self, turning on lights, clapping hands, lightly tapping legs or arms. Ginger, RN called to room to assist with wake up and to chaperone exam. Patient unable to keep eyes open through exam.  Inconsistent description of symptoms. Falling asleep during exam, responsive to voice. A&O x 4.  MAU Course  Procedures  MDM --Positive drug test --Patient unable to consistently describe symptoms due to extremely somnolent state  Patient Vitals for the past 24 hrs:  BP Temp Pulse Resp SpO2 Height Weight  10/25/17 1328 109/68 - 60 18 - - -  10/25/17 1058 110/68 97.6 F (36.4 C) (!) 52 16 100 % 5\' 9"   (1.753 m) 136 lb (61.7 kg)    Orders Placed This Encounter  Procedures  . Wet prep, genital  . Urinalysis, Routine w reflex microscopic  . Urine rapid drug screen (hosp performed)  . HIV antibody (routine testing) (NOT for [facility])  . Pregnancy, urine POC  . Discharge patient   Results for orders placed or performed during the hospital encounter of 10/25/17 (from the past 24 hour(s))  Urinalysis, Routine w reflex microscopic     Status: Abnormal   Collection Time: 10/25/17 11:17 AM  Result Value Ref Range   Color, Urine YELLOW YELLOW   APPearance CLEAR CLEAR   Specific Gravity, Urine 1.023 1.005 - 1.030   pH 5.0 5.0 - 8.0   Glucose, UA NEGATIVE NEGATIVE mg/dL   Hgb urine dipstick SMALL (A) NEGATIVE   Bilirubin Urine NEGATIVE NEGATIVE   Ketones, ur NEGATIVE NEGATIVE mg/dL   Protein, ur NEGATIVE NEGATIVE mg/dL   Nitrite NEGATIVE NEGATIVE   Leukocytes, UA NEGATIVE NEGATIVE   RBC / HPF 0-5 0 - 5 RBC/hpf   WBC, UA 0-5 0 - 5 WBC/hpf   Bacteria, UA NONE SEEN NONE SEEN  Squamous Epithelial / LPF 0-5 0 - 5   Mucus PRESENT   Pregnancy, urine POC     Status: None   Collection Time: 10/25/17 11:17 AM  Result Value Ref Range   Preg Test, Ur NEGATIVE NEGATIVE  Urine rapid drug screen (hosp performed)     Status: Abnormal   Collection Time: 10/25/17 12:09 PM  Result Value Ref Range   Opiates NONE DETECTED NONE DETECTED   Cocaine POSITIVE (A) NONE DETECTED   Benzodiazepines NONE DETECTED NONE DETECTED   Amphetamines NONE DETECTED NONE DETECTED   Tetrahydrocannabinol POSITIVE (A) NONE DETECTED   Barbiturates NONE DETECTED NONE DETECTED  Wet prep, genital     Status: Abnormal   Collection Time: 10/25/17 12:09 PM  Result Value Ref Range   Yeast Wet Prep HPF POC NONE SEEN NONE SEEN   Trich, Wet Prep NONE SEEN NONE SEEN   Clue Cells Wet Prep HPF POC PRESENT (A) NONE SEEN   WBC, Wet Prep HPF POC FEW (A) NONE SEEN   Sperm NONE SEEN    Meds ordered this encounter  Medications  .  metroNIDAZOLE (FLAGYL) 500 MG tablet    Sig: Take 1 tablet (500 mg total) by mouth 2 (two) times daily.    Dispense:  14 tablet    Refill:  0    Order Specific Question:   Supervising Provider    Answer:   Reva Bores [[id number]]    Assessment and Plan  --32 y.o. [phone] non pregnant patient --POS THC and cocaine --bacterial vaginosis, patient left paper rx in MAU --patient stated she did not drive herself, reported she lives with her father and feels safe --visually confirmed she left with adult female --Reviewed general instructions for return to MAU --Discharge home  Calvert Cantor 10/25/2017, 3:12 PM

## 2017-10-26 LAB — GC/CHLAMYDIA PROBE AMP (~~LOC~~) NOT AT ARMC
Chlamydia: NEGATIVE
NEISSERIA GONORRHEA: NEGATIVE

## 2017-10-26 LAB — HIV ANTIBODY (ROUTINE TESTING W REFLEX): HIV SCREEN 4TH GENERATION: NONREACTIVE

## 2017-11-29 ENCOUNTER — Inpatient Hospital Stay (HOSPITAL_COMMUNITY)
Admission: AD | Admit: 2017-11-29 | Discharge: 2017-11-29 | Discharge status: Against Medical Advice | Payer: Self-pay | Source: Ambulatory Visit | Attending: Obstetrics and Gynecology | Admitting: Obstetrics and Gynecology

## 2017-11-29 ENCOUNTER — Ambulatory Visit (INDEPENDENT_AMBULATORY_CARE_PROVIDER_SITE_OTHER): Payer: Self-pay | Admitting: *Deleted

## 2017-11-29 ENCOUNTER — Encounter: Payer: Self-pay | Admitting: Obstetrics & Gynecology

## 2017-11-29 DIAGNOSIS — Z3200 Encounter for pregnancy test, result unknown: Secondary | ICD-10-CM

## 2017-11-29 DIAGNOSIS — Z3201 Encounter for pregnancy test, result positive: Secondary | ICD-10-CM

## 2017-11-29 LAB — POCT PREGNANCY, URINE: Preg Test, Ur: POSITIVE — AB

## 2017-11-29 NOTE — MAU Note (Signed)
After registering, decided to go to clinic.

## 2017-11-29 NOTE — Progress Notes (Signed)
Pt was not able to wait for her UPT test results earlier today and asked to be called with the information. I called @ 1423 and informed her of +UPT. Pt asked to be called back later with the additional information I was trying to give to her. I called back @ 1555 and heard message stating that the voicemail is full - will keep trying.   8/27  1200 Called pt and discussed test results.  She is completely unsure of LMP - possible sometime in June or early July.  Pt is undecided whether she will continue the pregnancy or have a termination.  Medication reconciliation completed and pt advised not to take NSAIDS @ present time. Pt was advised to return to office for verification of pregnancy letter if she decides to continue the pregnancy so that she can apply for Medicaid. We will inform her of where she can schedule prenatal care @ that time. Pt may call back if she has any additional questions.  Pt voiced understanding of all information and instructions given.

## 2017-12-01 NOTE — Progress Notes (Signed)
I have reviewed the chart and agree with nursing staff's documentation of this patient's encounter.  Thressa ShellerHeather Hogan, CNM 12/01/2017 8:58 AM

## 2019-04-26 IMAGING — US US OB LIMITED
1 series · 14 of 19 positions shown · non-contrast
Comparison: CT 04/15/2014.

CLINICAL DATA: Vaginal bleeding during pregnancy.

EXAM:
LIMITED OBSTETRIC ULTRASOUND

[Series 1: us ob limited · 19 acquisitions, 14 frames shown]
[im 1/19]
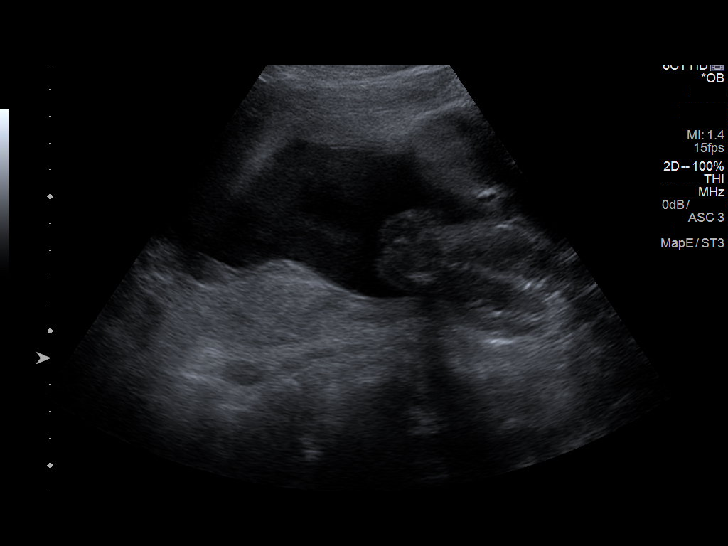
[im 3/19]
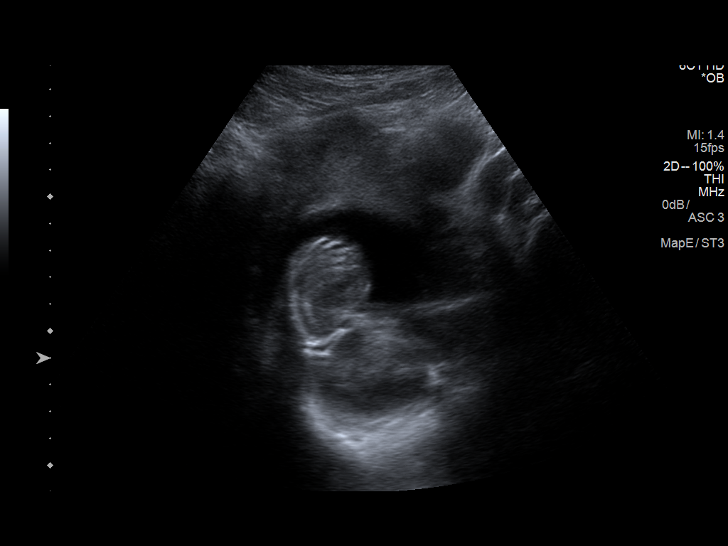
[im 4/19]
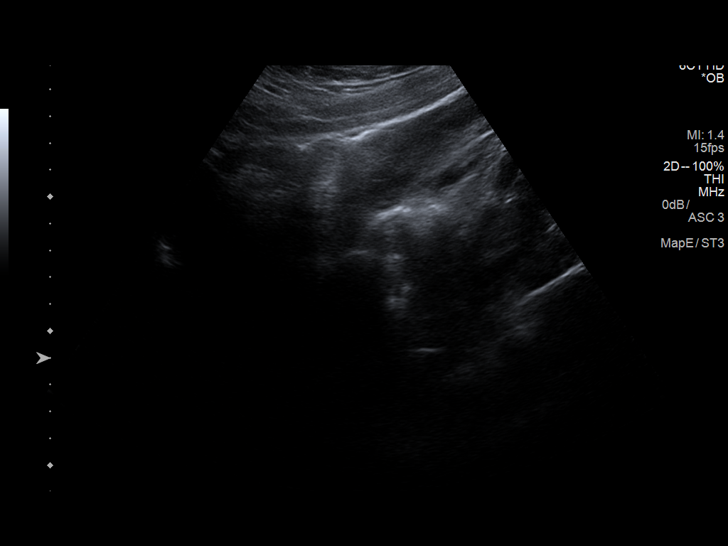
[im 5/19]
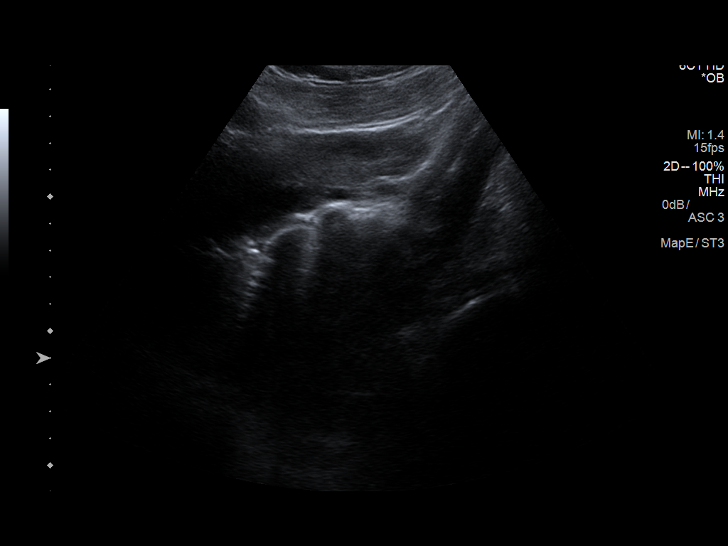
[im 7/19]
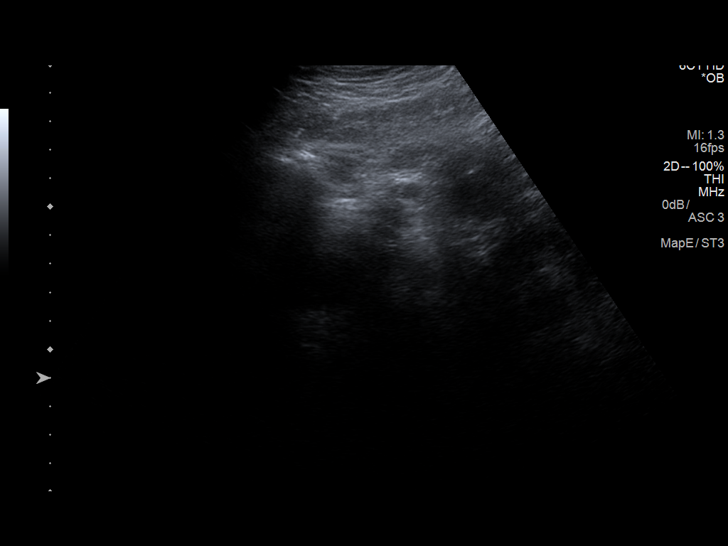
[im 8/19]
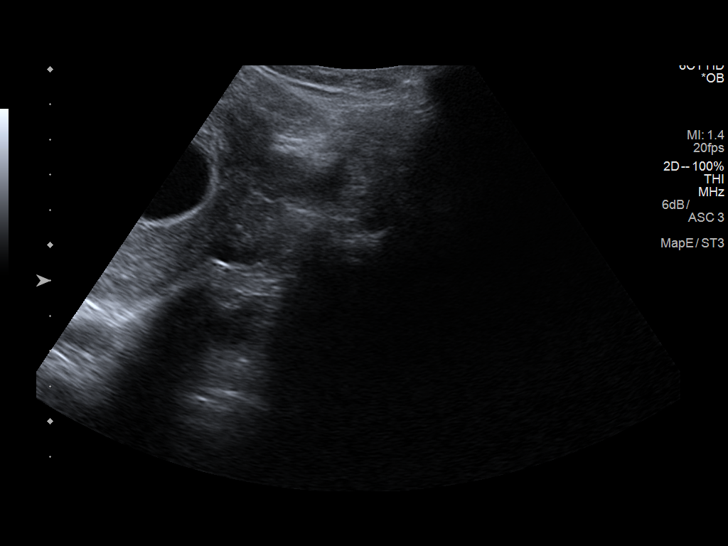
[im 9/19]
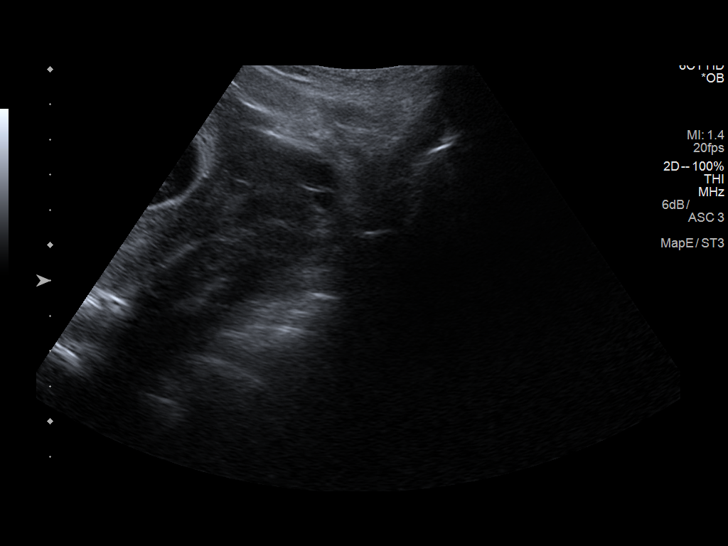
[im 11/19]
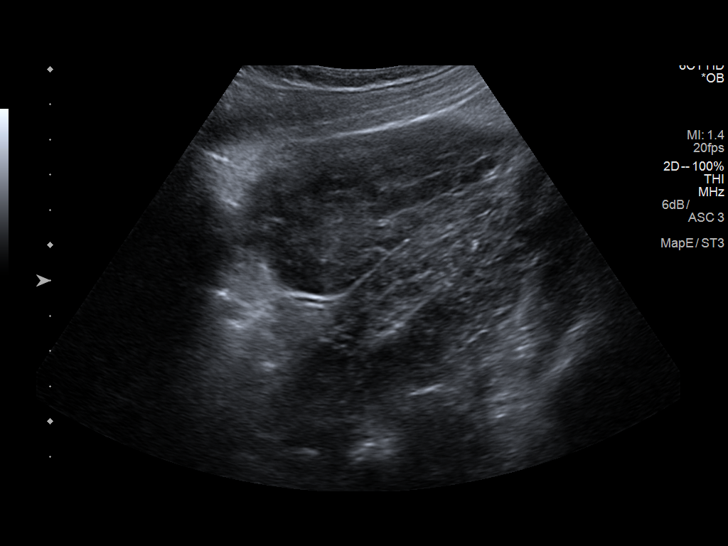
[im 12/19]
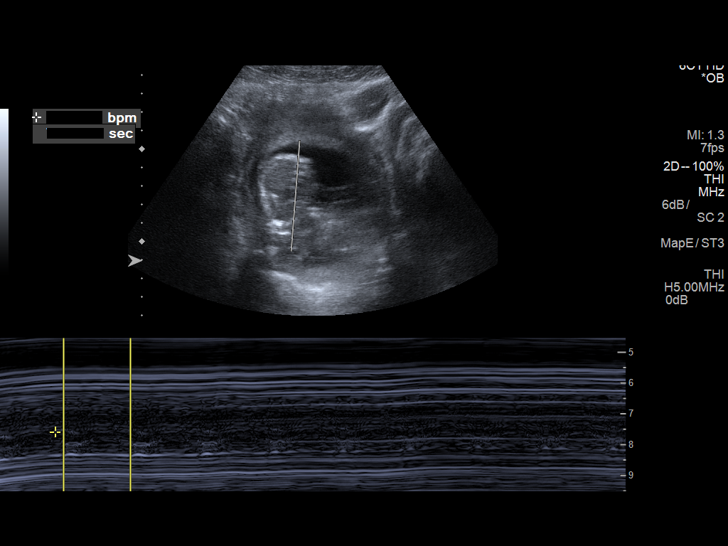
[im 13/19]
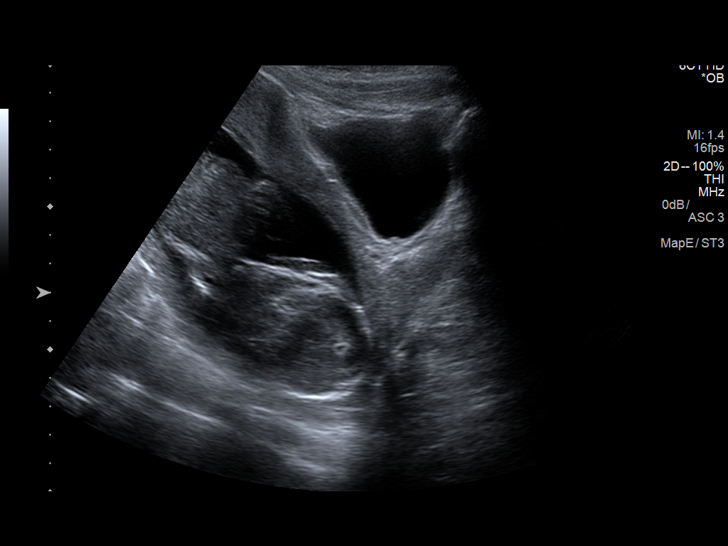
[im 15/19]
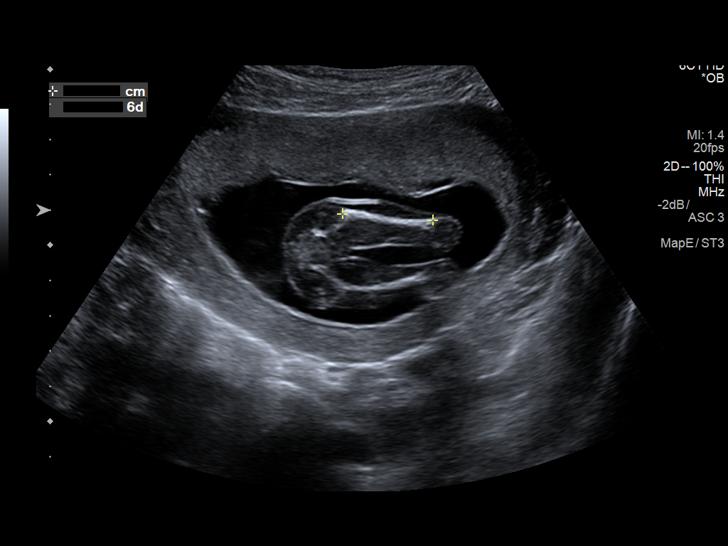
[im 16/19]
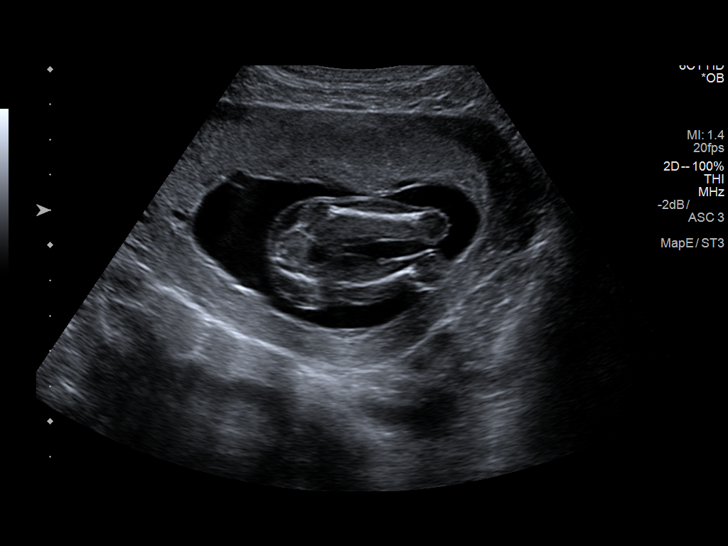
[im 17/19]
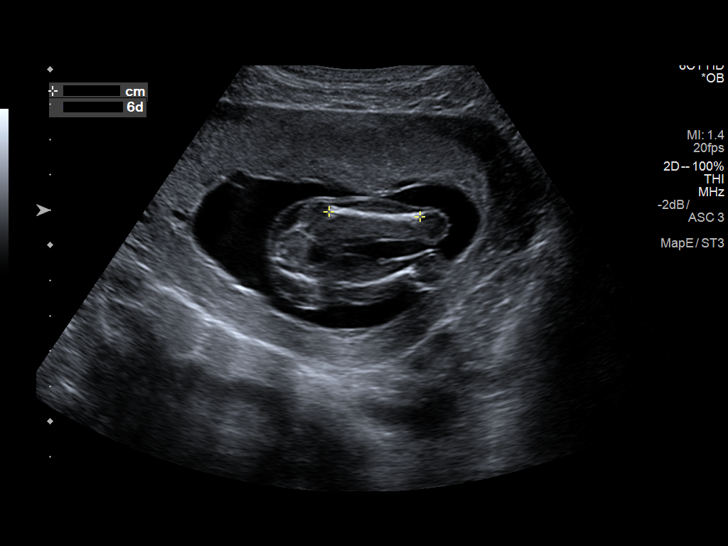
[im 19/19]
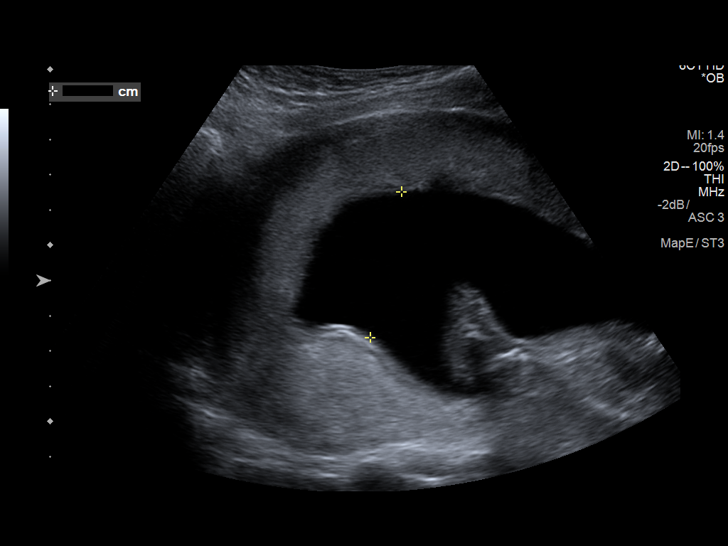

[14 of 19 positions shown; findings below may reference images not displayed]

FINDINGS: Number of Fetuses: 1

Heart Rate:  153 bpm

Movement: Present

Presentation: Cephalic

Placental Location: Fundal

Previa: No

Amniotic Fluid (Subjective):  Within normal limits.

Femur length:  2.6cm 17w  6d

MATERNAL FINDINGS:

Cervix:  Appears closed.

Uterus/Adnexae: Cervix appears open. Fetal head appears lodged in
the open cervix.
IMPRESSION: Single viable injury pregnancy at 17 weeks 6 days. Fetal head
appears lodged in an open cervix. Emergency gynecologic consultation
suggested.

This exam is performed on an emergent basis and does not
comprehensively evaluate fetal size, dating, or anatomy; follow-up
complete OB US should be considered if further fetal assessment is
warranted.

## 2019-09-11 ENCOUNTER — Ambulatory Visit (HOSPITAL_COMMUNITY)
Admission: EM | Admit: 2019-09-11 | Discharge: 2019-09-11 | Disposition: A | Discharge status: Home / Self Care | Payer: Self-pay | Attending: Family Medicine | Admitting: Family Medicine

## 2019-09-11 ENCOUNTER — Encounter (HOSPITAL_COMMUNITY): Payer: Self-pay

## 2019-09-11 ENCOUNTER — Other Ambulatory Visit: Payer: Self-pay

## 2019-09-11 DIAGNOSIS — L0291 Cutaneous abscess, unspecified: Secondary | ICD-10-CM

## 2019-09-11 MED ORDER — SULFAMETHOXAZOLE-TRIMETHOPRIM 800-160 MG PO TABS: 1.0000 | ORAL_TABLET | Freq: Two times a day (BID) | ORAL | 0 refills | Status: AC

## 2019-09-11 MED ORDER — LIDOCAINE-EPINEPHRINE 2 %-1:200000 IJ SOLN
INTRAMUSCULAR | Status: AC
Start: 2019-09-11 — End: ?
  Filled 2019-09-11: qty 20

## 2019-09-11 NOTE — Discharge Instructions (Addendum)
Keep dry and covered for next 24-48 hours  You may then wash site daily with warm water and mild soap Keep covered to avoid friction  Take antibiotic as prescribed and to completion  Return sooner or go to the ED if you have any new or worsening symptoms such as increased redness, swelling, pain, nausea, vomiting, fever, chills

## 2019-09-11 NOTE — ED Provider Notes (Signed)
[name]   [id number] 09/11/19 Arrival Time: 0806   [id number]  SUBJECTIVE:  [name] is a 34 y.o. female who presents with a possible abscess of the top of her head. Onset gradual, approximately 1 week ago.   ROS: As per HPI.  All other pertinent ROS negative.     Past Medical History:  Diagnosis Date  . Asthma   . Current smoker   . Gonorrhea affecting pregnancy in second trimester, antepartum 11/07/2013   +GC 8/315  Notified 8/4, appt to come in for rocephin, azithromycin rx sent in       POC: negative 8/26   . Polysubstance abuse ([place])   . Schizophrenia [facility])    Past Surgical History:  Procedure Laterality Date  . BREAST ENHANCEMENT SURGERY    . breat implants    . DIAGNOSTIC LAPAROSCOPY WITH REMOVAL OF ECTOPIC PREGNANCY N/A 06/25/2017   Procedure: DIAGNOSTIC LAPAROSCOPY WITH REMOVAL OF ECTOPIC PREGNANCY;  Surgeon: Mora Bellman, MD;  Location: [facility];  Service: Gynecology;  Laterality: N/A;  . DILATION AND CURETTAGE OF UTERUS N/A 10/30/2016   Procedure: DILATATION AND CURETTAGE;  Surgeon: Chancy Milroy, MD;  Location: [place];  Service: Gynecology;  Laterality: N/A;  . LAPAROSCOPIC UNILATERAL SALPINGECTOMY Left 06/25/2017   Procedure: LAPAROSCOPIC UNILATERAL SALPINGECTOMY;  Surgeon: Mora Bellman, MD;  Location: [facility];  Service: Gynecology;  Laterality: Left;   Allergies  Allergen Reactions  . Penicillins Hives    Has patient had a PCN reaction causing immediate rash, facial/tongue/throat swelling, SOB or lightheadedness with hypotension: Yes Has patient had a PCN reaction causing severe rash involving mucus membranes or skin necrosis: No Has patient had a PCN reaction that required hospitalization: No Has patient had a PCN reaction occurring within the last 10 years: No If all of the above answers are "NO", then may proceed with Cephalosporin use.    No current facility-administered medications on file prior to encounter.    Current Outpatient Medications on File Prior to Encounter  Medication Sig Dispense Refill  . albuterol (PROVENTIL HFA;VENTOLIN HFA) 108 (90 Base) MCG/ACT inhaler Inhale 1-2 puffs into the lungs every 6 (six) hours as needed for wheezing or shortness of breath.    Marland Kitchen ibuprofen (ADVIL,MOTRIN) 600 MG tablet Take 1 tablet (600 mg total) by mouth every 6 (six) hours as needed. (Patient not taking: Reported on 11/30/2017) 60 tablet 3  . tetrahydrozoline 0.05 % ophthalmic solution Place 1 drop into both eyes 3 (three) times daily as needed (dry eyes).     Social History   Socioeconomic History  . Marital status: Single    Spouse name: Not on file  . Number of children: Not on file  . Years of education: Not on file  . Highest education level: Not on file  Occupational History  . Not on file  Tobacco Use  . Smoking status: Current Some Day Smoker    Packs/day: 0.10    Types: Cigarettes  . Smokeless tobacco: Never Used  Substance and Sexual Activity  . Alcohol use: Yes  . Drug use: No    Types: Marijuana    Comment: Occasional, denies any use today,   . Sexual activity: Not Currently    Birth control/protection: None  Other Topics Concern  . Not on file  Social History Narrative  . Not on file   Social Determinants of Health   Financial Resource Strain:   . Difficulty of Paying Living Expenses:   Food Insecurity:   .  Worried About Programme researcher, broadcasting/film/video in the Last Year:   . Barista in the Last Year:   Transportation Needs:   . Freight forwarder (Medical):   Marland Kitchen Lack of Transportation (Non-Medical):   Physical Activity:   . Days of Exercise per Week:   . Minutes of Exercise per Session:   Stress:   . Feeling of Stress :   Social Connections:   . Frequency of Communication with Friends and Family:   . Frequency of Social Gatherings with Friends and Family:   . Attends Religious Services:   . Active Member of Clubs or Organizations:   . Attends Banker  Meetings:   Marland Kitchen Marital Status:   Intimate Partner Violence:   . Fear of Current or Ex-Partner:   . Emotionally Abused:   Marland Kitchen Physically Abused:   . Sexually Abused:    History reviewed. No pertinent family history.  OBJECTIVE:  Vitals:   09/11/19 0830  BP: (!) 94/55  Pulse: 80  Resp: 16  Temp: 98.8 F (37.1 C)  SpO2: 96%     General appearance: alert; no distress Skin: 4 cm induration of her superior scalp; tender to touch; no active drainage Psychological: alert and cooperative; normal mood and affect  Procedure: Verbal consent obtained. Area over induration cleaned with betadine. Lidocaine 2% with epinephrine used to obtain local anesthesia. The most fluctuant portion of the abscess was incised with a #11 blade scalpel. Abscess cavity explored and evacuated. Dressed with a clean gauze dressing. Minimal bleeding. No complications.  ASSESSMENT & PLAN:  1. Abscess     Meds ordered this encounter  Medications  . sulfamethoxazole-trimethoprim (BACTRIM DS) 800-160 MG tablet    Sig: Take 1 tablet by mouth 2 (two) times daily for 7 days.    Dispense:  14 tablet    Refill:  0    Order Specific Question:   Supervising Provider    Answer:   Merrilee Jansky [id number]     Abscess with incision and drainage:  Keep dry and covered for next 24-48 hours After packing is removed in you may then begin appling warm compresses 3-4x daily for 10-15 minutes.  You may then wash site daily with warm water and mild soap Keep covered to avoid friction Take antibiotic as prescribed and to completion Return sooner or go to the ED if you have any new or worsening symptoms such as increased redness, swelling, pain, nausea, vomiting, fever, chills.   Reviewed expectations re: course of current medical issues. Questions answered. Outlined signs and symptoms indicating need for more acute intervention. Patient verbalized understanding.  After Visit Summary given.          Moshe Cipro, NP 09/11/19 0930

## 2019-09-11 NOTE — ED Triage Notes (Signed)
Pt c/o abscess to top of scalp x 1 week, pt states has drained a little

## 2020-11-20 ENCOUNTER — Encounter (HOSPITAL_BASED_OUTPATIENT_CLINIC_OR_DEPARTMENT_OTHER): Payer: Self-pay | Admitting: *Deleted

## 2020-11-20 ENCOUNTER — Emergency Department (HOSPITAL_BASED_OUTPATIENT_CLINIC_OR_DEPARTMENT_OTHER)
Admission: EM | Admit: 2020-11-20 | Discharge: 2020-11-20 | Disposition: A | Discharge status: Home / Self Care | Payer: Self-pay | Attending: Emergency Medicine | Admitting: Emergency Medicine

## 2020-11-20 ENCOUNTER — Emergency Department (HOSPITAL_BASED_OUTPATIENT_CLINIC_OR_DEPARTMENT_OTHER): Payer: Self-pay

## 2020-11-20 ENCOUNTER — Other Ambulatory Visit: Payer: Self-pay

## 2020-11-20 DIAGNOSIS — L03213 Periorbital cellulitis: Secondary | ICD-10-CM | POA: Insufficient documentation

## 2020-11-20 DIAGNOSIS — J45909 Unspecified asthma, uncomplicated: Secondary | ICD-10-CM | POA: Insufficient documentation

## 2020-11-20 DIAGNOSIS — Z87891 Personal history of nicotine dependence: Secondary | ICD-10-CM | POA: Insufficient documentation

## 2020-11-20 LAB — BASIC METABOLIC PANEL
Anion gap: 8 (ref 5–15)
BUN: 7 mg/dL (ref 6–20)
CO2: 29 mmol/L (ref 22–32)
Calcium: 8.6 mg/dL — ABNORMAL LOW (ref 8.9–10.3)
Chloride: 103 mmol/L (ref 98–111)
Creatinine, Ser: 0.97 mg/dL (ref 0.44–1.00)
GFR, Estimated: >60 mL/min (ref >60)
Glucose, Bld: 80 mg/dL (ref 70–99)
Potassium: 3.7 mmol/L (ref 3.5–5.1)
Sodium: 140 mmol/L (ref 135–145)

## 2020-11-20 LAB — CBC WITH DIFFERENTIAL/PLATELET
Abs Immature Granulocytes: 0.04 10*3/uL (ref 0.00–0.07)
Basophils Absolute: 0 10*3/uL (ref 0.0–0.1)
Basophils Relative: 0 %
Eosinophils Absolute: 0 10*3/uL (ref 0.0–0.5)
Eosinophils Relative: 0 %
HCT: 41.6 % (ref 36.0–46.0)
Hemoglobin: 13.5 g/dL (ref 12.0–15.0)
Immature Granulocytes: 0 %
Lymphocytes Relative: 24 %
Lymphs Abs: 2.6 10*3/uL (ref 0.7–4.0)
MCH: 29.3 pg (ref 26.0–34.0)
MCHC: 32.5 g/dL (ref 30.0–36.0)
MCV: 90.4 fL (ref 80.0–100.0)
Monocytes Absolute: 1 10*3/uL (ref 0.1–1.0)
Monocytes Relative: 9 %
Neutro Abs: 7.2 10*3/uL (ref 1.7–7.7)
Neutrophils Relative %: 67 %
Platelets: 266 10*3/uL (ref 150–400)
RBC: 4.6 MIL/uL (ref 3.87–5.11)
RDW: 14.4 % (ref 11.5–15.5)
WBC: 10.8 10*3/uL — ABNORMAL HIGH (ref 4.0–10.5)
nRBC: 0 % (ref 0.0–0.2)

## 2020-11-20 MED ORDER — TETRACAINE HCL 0.5 % OP SOLN
2.0000 [drp] | Freq: Once | OPHTHALMIC | Status: DC
  Filled 2020-11-20: qty 4

## 2020-11-20 MED ORDER — SULFAMETHOXAZOLE-TRIMETHOPRIM 800-160 MG PO TABS: 1.0000 | ORAL_TABLET | Freq: Two times a day (BID) | ORAL | 0 refills | Status: DC

## 2020-11-20 MED ORDER — CEPHALEXIN 500 MG PO CAPS: 500.0000 mg | ORAL_CAPSULE | Freq: Four times a day (QID) | ORAL | 0 refills | Status: AC

## 2020-11-20 MED ORDER — FLUORESCEIN SODIUM 1 MG OP STRP
1.0000 | ORAL_STRIP | Freq: Once | OPHTHALMIC | Status: DC
  Filled 2020-11-20: qty 1

## 2020-11-20 MED ORDER — SULFAMETHOXAZOLE-TRIMETHOPRIM 800-160 MG PO TABS: 1.0000 | ORAL_TABLET | Freq: Two times a day (BID) | ORAL | 0 refills | Status: AC

## 2020-11-20 MED ORDER — IOHEXOL 350 MG/ML SOLN
60.0000 mL | Freq: Once | INTRAVENOUS | Status: AC | PRN
  Administered 2020-11-20: 60 mL via INTRAVENOUS

## 2020-11-20 MED ORDER — CEPHALEXIN 500 MG PO CAPS: 500.0000 mg | ORAL_CAPSULE | Freq: Four times a day (QID) | ORAL | 0 refills | Status: DC

## 2020-11-20 NOTE — ED Triage Notes (Addendum)
Eye swollen with dry drainage noted, Symptoms started yesterday. Falls asleep in triage, Admits to Cocaine and marijuana use today.

## 2020-11-20 NOTE — Discharge Instructions (Addendum)
You have been seen and discharged from the emergency department.  Your CAT scan shows that you have infection/cellulitis of the left side of the face, this does not spread to any of the deeper structures.  Take antibiotics as prescribed.  Follow-up with your primary provider for reevaluation and further care. Take home medications as prescribed. If you have any worsening symptoms or further concerns for your health please return to an emergency department for further evaluation.

## 2020-11-20 NOTE — ED Notes (Signed)
Patient transported to CT 

## 2020-11-20 NOTE — ED Provider Notes (Signed)
[facility] EMERGENCY DEPARTMENT Provider Note  CSN: [id number] Arrival date & time: 11/20/20 1154    History Chief Complaint  Patient presents with  . Eye Pain  . Facial Swelling    [name] is a 35 y.o. female presents for evaluation of swelling to the left side of her face onset 2 days ago after she tried to pop a pimple on her L temple. She has had subjective fevers. No blurry vision. She denies any injuries/trauma but is not forthcoming with history otherwise.    Past Medical History:  Diagnosis Date  . Asthma   . Current smoker   . Gonorrhea affecting pregnancy in second trimester, antepartum 11/07/2013   +GC 8/315  Notified 8/4, appt to come in for rocephin, azithromycin rx sent in       POC: negative 8/26   . Polysubstance abuse ([facility])   . Schizophrenia [facility])     Past Surgical History:  Procedure Laterality Date  . BREAST ENHANCEMENT SURGERY    . breat implants    . DIAGNOSTIC LAPAROSCOPY WITH REMOVAL OF ECTOPIC PREGNANCY N/A 06/25/2017   Procedure: DIAGNOSTIC LAPAROSCOPY WITH REMOVAL OF ECTOPIC PREGNANCY;  Surgeon: Catalina Antigua, MD;  Location: [facility];  Service: Gynecology;  Laterality: N/A;  . DILATION AND CURETTAGE OF UTERUS N/A 10/30/2016   Procedure: DILATATION AND CURETTAGE;  Surgeon: Hermina Staggers, MD;  Location: [facility];  Service: Gynecology;  Laterality: N/A;  . LAPAROSCOPIC UNILATERAL SALPINGECTOMY Left 06/25/2017   Procedure: LAPAROSCOPIC UNILATERAL SALPINGECTOMY;  Surgeon: Catalina Antigua, MD;  Location: [facility];  Service: Gynecology;  Laterality: Left;    No family history on file.  Social History   Tobacco Use  . Smoking status: Former    Packs/day: 0.10    Types: Cigarettes  . Smokeless tobacco: Never  Substance Use Topics  . Alcohol use: Yes  . Drug use: Yes    Types: Marijuana    Comment: Cocaine, Marijuana     Home Medications Prior to Admission medications   Medication Sig Start Date End Date Taking?  Authorizing Provider  albuterol (PROVENTIL HFA;VENTOLIN HFA) 108 (90 Base) MCG/ACT inhaler Inhale 1-2 puffs into the lungs every 6 (six) hours as needed for wheezing or shortness of breath.    [provider]  ibuprofen (ADVIL,MOTRIN) 600 MG tablet Take 1 tablet (600 mg total) by mouth every 6 (six) hours as needed. Patient not taking: Reported on 11/30/2017 06/25/17   Constant, Peggy, MD  tetrahydrozoline 0.05 % ophthalmic solution Place 1 drop into both eyes 3 (three) times daily as needed (dry eyes).    [provider]     Allergies    Penicillins   Review of Systems   Review of Systems Unable to assess due to mental status.    Physical Exam BP 114/75 (BP Location: Right Arm)   Pulse 64   Temp 100 F (37.8 C) (Oral)   Resp 14   Ht 5\' 9"  (1.753 m)   Wt 57.6 kg   SpO2 100%   BMI 18.75 kg/m   Physical Exam Vitals and nursing note reviewed.  HENT:     Head: Normocephalic.     Nose: Nose normal.  Eyes:     Extraocular Movements: Extraocular movements intact.     Pupils: Pupils are equal, round, and reactive to light.     Comments: Marked periorbital erythema and induration originating at at small raised lesion on her L temple, globe is intact, grossly normal within the limits  of her swelling  Pulmonary:     Effort: Pulmonary effort is normal.  Musculoskeletal:        General: Normal range of motion.     Cervical back: Neck supple.  Skin:    Findings: No rash (on exposed skin).  Neurological:     Mental Status: She is alert and oriented to person, place, and time.  Psychiatric:        Mood and Affect: Mood normal.      ED Results / Procedures / Treatments   Labs (all labs ordered are listed, but only abnormal results are displayed) Labs Reviewed - No data to display  EKG None   Radiology No results found.  Procedures Procedures  Medications Ordered in the ED Medications  tetracaine (PONTOCAINE) 0.5 % ophthalmic solution 2 drop (has no  administration in time range)  fluorescein ophthalmic strip 1 strip (has no administration in time range)     MDM Rules/Calculators/A&P MDM Patient with exam consistent with periorbital cellulitis, will check labs and CT to rule out deep infection. She is not able to tolerate a detailed ocular exam but her globe, conjunctiva and anterior chamber appear grossly normal. Normal pupillary response.  ED Course  I have reviewed the triage vital signs and the nursing notes.  Pertinent labs & imaging results that were available during my care of the patient were reviewed by me and considered in my medical decision making (see chart for details).  Clinical Course as of 11/21/20 0657  Wed Nov 20, 2020  1419 CBC with mild leukocytosis.  [CS]  1457 BMP is normal.  [CS]  1515 Care of the patient signed out to Dr. Hyman Bower at the change of shift.  [CS]    Clinical Course User Index [CS] Pollyann Savoy, MD    Final Clinical Impression(s) / ED Diagnoses Final diagnoses:  None    Rx / DC Orders ED Discharge Orders     None        Pollyann Savoy, MD 11/21/20 [phone]

## 2020-11-20 NOTE — ED Provider Notes (Addendum)
Patient signed out to me by previous provider. Please refer to their note for full HPI.  Briefly this is a 35 year old female who presented with left-sided periorbital swelling/redness.  She is pending CT to evaluate for preseptal versus post septal cellulitis.  Vitals are stable, resting comfortably.  Patient reportedly declined pregnancy test.  Understand risks of imaging/antibiotic therapy in pregnancy without obtaining a pregnancy test here in the department. Physical Exam  BP 117/79 (BP Location: Right Arm)   Pulse 62   Temp 100 F (37.8 C) (Oral)   Resp 20   Ht 5\' 9"  (1.753 m)   Wt 57.6 kg   SpO2 100%   BMI 18.75 kg/m   Physical Exam Vitals and nursing note reviewed.  Constitutional:      Appearance: Normal appearance.  HENT:     Head: Normocephalic.     Mouth/Throat:     Mouth: Mucous membranes are moist.  Eyes:     Extraocular Movements: Extraocular movements intact.     Pupils: Pupils are equal, round, and reactive to light.     Comments: Left periorbital edema/erythema and cellulitis extending to the left cheek  Cardiovascular:     Rate and Rhythm: Normal rate.  Pulmonary:     Effort: Pulmonary effort is normal. No respiratory distress.  Abdominal:     Palpations: Abdomen is soft.     Tenderness: There is no abdominal tenderness.  Skin:    General: Skin is warm.  Neurological:     Mental Status: She is alert and oriented to person, place, and time. Mental status is at baseline.  Psychiatric:        Mood and Affect: Mood normal.    ED Course/Procedures   Clinical Course as of 11/20/20 1814  Wed Nov 20, 2020  1419 CBC with mild leukocytosis.  [CS]  1457 BMP is normal.  [CS]  1515 Care of the patient signed out to Dr. Nov 22, 2020 at the change of shift.  [CS]    Clinical Course User Index [CS] Hyman Bower, MD    Procedures  MDM    CT shows preseptal cellulitis without any spread to the deeper structures.  We will treat the patient with oral  antibiotics.  Consulted with pharmacist given her penicillin allergy, she has taken cephalosporins in the past without any allergic reaction.  We will place her on Bactrim and Keflex for outpatient follow-up.  Patient at this time appears safe and stable for discharge and will be treated as an outpatient.  Discharge plan and strict return to ED precautions discussed, patient verbalizes understanding and agreement.       Pollyann Savoy, DO 11/20/20 1708    11/22/20, DO 11/20/20 1814

## 2021-11-01 ENCOUNTER — Inpatient Hospital Stay (HOSPITAL_COMMUNITY)
Admission: AD | Admit: 2021-11-01 | Discharge: 2021-11-01 | Disposition: A | Discharge status: Home / Self Care | Payer: Self-pay | Attending: Obstetrics and Gynecology | Admitting: Obstetrics and Gynecology

## 2021-11-01 ENCOUNTER — Other Ambulatory Visit: Payer: Self-pay

## 2021-11-01 DIAGNOSIS — Z3202 Encounter for pregnancy test, result negative: Secondary | ICD-10-CM | POA: Insufficient documentation

## 2021-11-01 LAB — HCG, QUANTITATIVE, PREGNANCY: hCG, Beta Chain, Quant, S: <1 m[IU]/mL (ref <5)

## 2021-11-01 LAB — POCT PREGNANCY, URINE: Preg Test, Ur: NEGATIVE

## 2021-11-01 NOTE — MAU Provider Note (Signed)
Event Date/Time   First Provider Initiated Contact with Patient 11/01/21 1344      S [name] is a 36 y.o. [id number] patient who presents to MAU today with complaint of wanting to know if she is pregnant. Patient states last week she had two pregnancy tests at home that had a faint positive line. Patient states last night she started having bleeding and cramping which feel exactly like her period. Patient came to MAU today to find out if she was pregnant. UPT negative in MAU.   O BP 120/72 (BP Location: Right Arm)   Pulse 69   Temp 97.9 F (36.6 C) (Oral)   Resp 17   Ht 5\' 9"  (1.753 m)   Wt 59.7 kg   LMP 09/11/2021 (Approximate)   SpO2 98%   BMI 19.43 kg/m   Patient Vitals for the past 24 hrs:  BP Temp Temp src Pulse Resp SpO2 Height Weight  11/01/21 1328 120/72 97.9 F (36.6 C) Oral 69 17 98 % 5\' 9"  (1.753 m) 59.7 kg   Physical Exam Vitals and nursing note reviewed.  Constitutional:      General: She is not in acute distress.    Appearance: Normal appearance. She is not ill-appearing, toxic-appearing or diaphoretic.  HENT:     Head: Normocephalic and atraumatic.  Pulmonary:     Effort: Pulmonary effort is normal.  Neurological:     Mental Status: She is alert and oriented to person, place, and time.  Psychiatric:        Mood and Affect: Mood normal.        Behavior: Behavior normal.        Thought Content: Thought content normal.        Judgment: Judgment normal.    A Medical screening exam complete UPT negative hCG ordered  P Patient given the option to wait for test results in MAU or wait at home, discussed likelihood of positive blood results with negative UPT, pt emphatically wishes to go home and be called with results Confirmed and updated patient phone number in chart, repeated back to patient Discharge from MAU in stable condition Warning signs for worsening condition that would warrant emergency follow-up discussed Patient may return to MAU  as needed  Attempted to call patient with negative hCG results, no answer, HIPAA compliant voicemail left  Shakedra Beam, 11/03/21, NP 11/01/2021 3:13 PM

## 2021-11-01 NOTE — MAU Note (Signed)
[name] is a 36 y.o. at Unknown here in MAU reporting: + HPT , faint 2nd line last wk and this wk.  Started cramping and bleeding about an hour ago. Not soaking pad yet.  Pain is rt lower side LMP: first wk in June Onset of complaint: hour ago Pain score: 8 Vitals:   11/01/21 1328  BP: 120/72  Pulse: 69  Resp: 17  Temp: 97.9 F (36.6 C)  SpO2: 98%      Lab orders placed from triage:  UPT, UA if +

## 2022-02-01 ENCOUNTER — Emergency Department (HOSPITAL_COMMUNITY): Payer: Self-pay

## 2022-02-01 ENCOUNTER — Encounter (HOSPITAL_COMMUNITY): Payer: Self-pay | Admitting: Emergency Medicine

## 2022-02-01 ENCOUNTER — Emergency Department (HOSPITAL_COMMUNITY)
Admission: EM | Admit: 2022-02-01 | Discharge: 2022-02-01 | Discharge status: Against Medical Advice | Payer: Self-pay | Attending: Student | Admitting: Student

## 2022-02-01 ENCOUNTER — Other Ambulatory Visit: Payer: Self-pay

## 2022-02-01 DIAGNOSIS — Z5321 Procedure and treatment not carried out due to patient leaving prior to being seen by health care provider: Secondary | ICD-10-CM | POA: Insufficient documentation

## 2022-02-01 DIAGNOSIS — R1031 Right lower quadrant pain: Secondary | ICD-10-CM | POA: Insufficient documentation

## 2022-02-01 LAB — CBC WITH DIFFERENTIAL/PLATELET
Abs Immature Granulocytes: 0.02 10*3/uL (ref 0.00–0.07)
Basophils Absolute: 0 10*3/uL (ref 0.0–0.1)
Basophils Relative: 1 %
Eosinophils Absolute: 0.1 10*3/uL (ref 0.0–0.5)
Eosinophils Relative: 1 %
HCT: 36.3 % (ref 36.0–46.0)
Hemoglobin: 11.7 g/dL — ABNORMAL LOW (ref 12.0–15.0)
Immature Granulocytes: 0 %
Lymphocytes Relative: 52 %
Lymphs Abs: 3.5 10*3/uL (ref 0.7–4.0)
MCH: 30.2 pg (ref 26.0–34.0)
MCHC: 32.2 g/dL (ref 30.0–36.0)
MCV: 93.6 fL (ref 80.0–100.0)
Monocytes Absolute: 0.7 10*3/uL (ref 0.1–1.0)
Monocytes Relative: 10 %
Neutro Abs: 2.4 10*3/uL (ref 1.7–7.7)
Neutrophils Relative %: 36 %
Platelets: 262 10*3/uL (ref 150–400)
RBC: 3.88 MIL/uL (ref 3.87–5.11)
RDW: 13.6 % (ref 11.5–15.5)
WBC: 6.7 10*3/uL (ref 4.0–10.5)
nRBC: 0 % (ref 0.0–0.2)

## 2022-02-01 LAB — COMPREHENSIVE METABOLIC PANEL
ALT: 11 U/L (ref 0–44)
AST: 16 U/L (ref 15–41)
Albumin: 3.8 g/dL (ref 3.5–5.0)
Alkaline Phosphatase: 34 U/L — ABNORMAL LOW (ref 38–126)
Anion gap: 6 (ref 5–15)
BUN: 9 mg/dL (ref 6–20)
CO2: 23 mmol/L (ref 22–32)
Calcium: 8.6 mg/dL — ABNORMAL LOW (ref 8.9–10.3)
Chloride: 109 mmol/L (ref 98–111)
Creatinine, Ser: 0.72 mg/dL (ref 0.44–1.00)
GFR, Estimated: >60 mL/min (ref >60)
Glucose, Bld: 93 mg/dL (ref 70–99)
Potassium: 3.8 mmol/L (ref 3.5–5.1)
Sodium: 138 mmol/L (ref 135–145)
Total Bilirubin: 0.4 mg/dL (ref 0.3–1.2)
Total Protein: 6.5 g/dL (ref 6.5–8.1)

## 2022-02-01 LAB — URINALYSIS, MICROSCOPIC (REFLEX)

## 2022-02-01 LAB — LIPASE, BLOOD: Lipase: 34 U/L (ref 11–51)

## 2022-02-01 LAB — I-STAT BETA HCG BLOOD, ED (MC, WL, AP ONLY): I-stat hCG, quantitative: <5 m[IU]/mL (ref <5)

## 2022-02-01 LAB — URINALYSIS, ROUTINE W REFLEX MICROSCOPIC
Bilirubin Urine: NEGATIVE
Glucose, UA: NEGATIVE mg/dL
Ketones, ur: NEGATIVE mg/dL
Leukocytes,Ua: NEGATIVE
Nitrite: NEGATIVE
Protein, ur: NEGATIVE mg/dL
Specific Gravity, Urine: >1.03 — ABNORMAL HIGH (ref 1.005–1.030)
pH: 6 (ref 5.0–8.0)

## 2022-02-01 NOTE — ED Provider Triage Note (Signed)
Emergency Medicine Provider Triage Evaluation Note  [name] , a 36 y.o. female  was evaluated in triage.  Pt complains of abdominal pain intermittently x 2 days, cramping to right lower quadrant/pelvis, increasing in frequency, no alleviating/aggravating factors. LMP sometime last month. Denies fever, chills, nausea, vomiting, dysuria, vaginal bleeding or vaginal discharge. Currently sexually active.   Review of Systems  Per above.  Physical Exam  BP 114/70   Pulse 67   Temp 98.2 F (36.8 C) (Oral)   Resp 16   SpO2 100%  Gen:   Awake, no distress   Resp:  Normal effort  MSK:   Moves extremities without difficulty  Other:  Right suprapubic TTP.   Medical Decision Making  Medically screening exam initiated at 12:43 AM.  Appropriate orders placed.  VERNIECE ENCARNACION was informed that the remainder of the evaluation will be completed by another provider, this initial triage assessment does not replace that evaluation, and the importance of remaining in the ED until their evaluation is complete.  Abdominal pain   Amaryllis Dyke, PA-C 02/01/22 0118

## 2022-02-01 NOTE — ED Notes (Signed)
Pt said she can't wait much longer she is leaving

## 2022-02-01 NOTE — ED Triage Notes (Signed)
Pt reported to ED with c/o RLQ abdominal pain stating "it feels like I am having contractions". Pt states pain has been present for three days. Denies any nausea or vomiting.

## 2022-02-06 ENCOUNTER — Emergency Department (HOSPITAL_BASED_OUTPATIENT_CLINIC_OR_DEPARTMENT_OTHER)
Admission: EM | Admit: 2022-02-06 | Discharge: 2022-02-07 | Discharge status: Against Medical Advice | Payer: Self-pay | Attending: Emergency Medicine | Admitting: Emergency Medicine

## 2022-02-06 ENCOUNTER — Encounter (HOSPITAL_BASED_OUTPATIENT_CLINIC_OR_DEPARTMENT_OTHER): Payer: Self-pay | Admitting: Emergency Medicine

## 2022-02-06 ENCOUNTER — Other Ambulatory Visit: Payer: Self-pay

## 2022-02-06 DIAGNOSIS — R519 Headache, unspecified: Secondary | ICD-10-CM | POA: Insufficient documentation

## 2022-02-06 DIAGNOSIS — N939 Abnormal uterine and vaginal bleeding, unspecified: Secondary | ICD-10-CM | POA: Insufficient documentation

## 2022-02-06 DIAGNOSIS — Z5321 Procedure and treatment not carried out due to patient leaving prior to being seen by health care provider: Secondary | ICD-10-CM | POA: Insufficient documentation

## 2022-02-06 DIAGNOSIS — M545 Low back pain, unspecified: Secondary | ICD-10-CM | POA: Insufficient documentation

## 2022-02-06 LAB — CBC WITH DIFFERENTIAL/PLATELET
Abs Immature Granulocytes: 0.01 10*3/uL (ref 0.00–0.07)
Basophils Absolute: 0 10*3/uL (ref 0.0–0.1)
Basophils Relative: 1 %
Eosinophils Absolute: 0 10*3/uL (ref 0.0–0.5)
Eosinophils Relative: 0 %
HCT: 38.6 % (ref 36.0–46.0)
Hemoglobin: 12.5 g/dL (ref 12.0–15.0)
Immature Granulocytes: 0 %
Lymphocytes Relative: 53 %
Lymphs Abs: 2.9 10*3/uL (ref 0.7–4.0)
MCH: 30 pg (ref 26.0–34.0)
MCHC: 32.4 g/dL (ref 30.0–36.0)
MCV: 92.6 fL (ref 80.0–100.0)
Monocytes Absolute: 0.5 10*3/uL (ref 0.1–1.0)
Monocytes Relative: 10 %
Neutro Abs: 2 10*3/uL (ref 1.7–7.7)
Neutrophils Relative %: 36 %
Platelets: 290 10*3/uL (ref 150–400)
RBC: 4.17 MIL/uL (ref 3.87–5.11)
RDW: 13.7 % (ref 11.5–15.5)
WBC: 5.4 10*3/uL (ref 4.0–10.5)
nRBC: 0 % (ref 0.0–0.2)

## 2022-02-06 LAB — COMPREHENSIVE METABOLIC PANEL
ALT: 9 U/L (ref 0–44)
AST: 14 U/L — ABNORMAL LOW (ref 15–41)
Albumin: 4.4 g/dL (ref 3.5–5.0)
Alkaline Phosphatase: 31 U/L — ABNORMAL LOW (ref 38–126)
Anion gap: 9 (ref 5–15)
BUN: 13 mg/dL (ref 6–20)
CO2: 26 mmol/L (ref 22–32)
Calcium: 8.9 mg/dL (ref 8.9–10.3)
Chloride: 103 mmol/L (ref 98–111)
Creatinine, Ser: 0.86 mg/dL (ref 0.44–1.00)
GFR, Estimated: >60 mL/min (ref >60)
Glucose, Bld: 89 mg/dL (ref 70–99)
Potassium: 3.6 mmol/L (ref 3.5–5.1)
Sodium: 138 mmol/L (ref 135–145)
Total Bilirubin: 0.4 mg/dL (ref 0.3–1.2)
Total Protein: 7.6 g/dL (ref 6.5–8.1)

## 2022-02-06 NOTE — ED Triage Notes (Signed)
Patient reports dark red bleeding that started an hour ago along with lower back pain and headache.  Patient reports LMP was in the beginning of September.

## 2022-02-07 LAB — HCG, QUANTITATIVE, PREGNANCY: hCG, Beta Chain, Quant, S: <1 m[IU]/mL (ref <5)

## 2022-09-16 ENCOUNTER — Other Ambulatory Visit: Payer: Self-pay

## 2022-09-16 ENCOUNTER — Emergency Department (HOSPITAL_COMMUNITY): Payer: Self-pay

## 2022-09-16 ENCOUNTER — Emergency Department (HOSPITAL_COMMUNITY)
Admission: EM | Admit: 2022-09-16 | Discharge: 2022-09-16 | Disposition: A | Discharge status: Home / Self Care | Payer: Self-pay | Attending: Emergency Medicine | Admitting: Emergency Medicine

## 2022-09-16 ENCOUNTER — Encounter (HOSPITAL_COMMUNITY): Payer: Self-pay

## 2022-09-16 DIAGNOSIS — W208XXA Other cause of strike by thrown, projected or falling object, initial encounter: Secondary | ICD-10-CM | POA: Insufficient documentation

## 2022-09-16 DIAGNOSIS — S0990XA Unspecified injury of head, initial encounter: Secondary | ICD-10-CM | POA: Insufficient documentation

## 2022-09-16 DIAGNOSIS — M542 Cervicalgia: Secondary | ICD-10-CM | POA: Insufficient documentation

## 2022-09-16 NOTE — ED Triage Notes (Addendum)
Pt arrives via EMS from home. Pt states yesterday afternoon, a ceiling fan fell on the top of her head. Pt denies loc, no blood thinners. Pt is AxOx4. Pt c/o pain to head, shoulders, lower back, and bilateral feet.

## 2022-09-16 NOTE — ED Provider Notes (Signed)
[place] EMERG[facility] DEPARTMENT AT [facility] Provider Note   CSN: [id number] Arrival date & time: 09/16/22  7846     History  Chief Complaint  Patient presents with   Head Injury    [name] is a 37 y.o. female.  Patient with history of polysubstance abuse presents today with complaints of head injury. She states that same occurred yesterday when a ceiling fan fell and hit her in the head. She did loose consciousness, is not anticoagulated. Endorses head and neck pain. Denies vision changes. NO sharp shooting pain in her extremities, saddle paresthesias or loss of bowel or bladder function. She has been able to walk since without issue.   The history is provided by the patient. No language interpreter was used.  Head Injury      Home Medications Prior to Admission medications   Medication Sig Start Date End Date Taking? Authorizing Provider  albuterol (PROVENTIL HFA;VENTOLIN HFA) 108 (90 Base) MCG/ACT inhaler Inhale 1-2 puffs into the lungs every 6 (six) hours as needed for wheezing or shortness of breath.    [provider]  cephALEXin (KEFLEX) 500 MG capsule Take 1 capsule (500 mg total) by mouth 4 (four) times daily. 11/20/20   Horton, Clabe Seal, DO  ibuprofen (ADVIL,MOTRIN) 600 MG tablet Take 1 tablet (600 mg total) by mouth every 6 (six) hours as needed. Patient not taking: Reported on 11/30/2017 06/25/17   Constant, Peggy, MD  tetrahydrozoline 0.05 % ophthalmic solution Place 1 drop into both eyes 3 (three) times daily as needed (dry eyes).    [provider]      Allergies    Penicillins    Review of Systems   Review of Systems  Musculoskeletal:  Positive for arthralgias and myalgias.  All other systems reviewed and are negative.   Physical Exam Updated Vital Signs BP 110/80   Pulse 65   Temp 98.3 F (36.8 C)   Resp 17   Ht 5\' 9"  (1.753 m)   Wt 58.1 kg   SpO2 98%   BMI 18.90 kg/m  Physical Exam Vitals and nursing  note reviewed.  Constitutional:      General: She is not in acute distress.    Appearance: Normal appearance. She is normal weight. She is not ill-appearing, toxic-appearing or diaphoretic.  HENT:     Head: Normocephalic and atraumatic.     Comments: No battles sign or racoon eyes Eyes:     Extraocular Movements: Extraocular movements intact.     Pupils: Pupils are equal, round, and reactive to light.  Cardiovascular:     Rate and Rhythm: Normal rate.     Comments: No tenderness to palpation of chest wall Pulmonary:     Effort: Pulmonary effort is normal. No respiratory distress.  Abdominal:     General: Abdomen is flat.     Palpations: Abdomen is soft.     Tenderness: There is no abdominal tenderness.  Musculoskeletal:        General: Normal range of motion.     Cervical back: Normal range of motion and neck supple.     Comments: Mild tenderness to palpation of the cervical spine with associated bilateral muscle soreness. No stepoffs, lesions, deformity, or overlying skin changes.   5/5 strength and sensation intact to bilateral upper extremities  No TTP of thoracic or lumbar spine. Patient observed to be ambulatory with steady gait.  No focal bony tenderness.  Skin:    General: Skin is warm and  dry.  Neurological:     General: No focal deficit present.     Mental Status: She is alert.  Psychiatric:        Mood and Affect: Mood normal.        Behavior: Behavior normal.     ED Results / Procedures / Treatments   Labs (all labs ordered are listed, but only abnormal results are displayed) Labs Reviewed - No data to display  EKG None  Radiology CT Head Wo Contrast  Result Date: 09/16/2022 CLINICAL DATA:  Provided history: Head trauma, moderate/severe. Neck trauma, midline tenderness. Pain in head, shoulders, low back and bilateral feet. EXAM: CT HEAD WITHOUT CONTRAST CT CERVICAL SPINE WITHOUT CONTRAST TECHNIQUE: Multidetector CT imaging of the head and cervical spine was  performed following the standard protocol without intravenous contrast. Multiplanar CT image reconstructions of the cervical spine were also generated. RADIATION DOSE REDUCTION: This exam was performed according to the departmental dose-optimization program which includes automated exposure control, adjustment of the mA and/or kV according to patient size and/or use of iterative reconstruction technique. COMPARISON:  No pertinent prior exams available for comparison. FINDINGS: CT HEAD FINDINGS Brain: Cerebral volume is normal. There is no acute intracranial hemorrhage. No demarcated cortical infarct. No extra-axial fluid collection. No evidence of an intracranial mass. No midline shift. Vascular: No hyperdense vessel. Skull: No fracture or aggressive osseous lesion. Sinuses/Orbits: No mass or acute finding within the imaged orbits. Minimal mucosal thickening within the right maxillary sinus. Other: Small irregular hyperdense focus within the right parietal scalp, which may reflect a hematoma given the provided history. CT CERVICAL SPINE FINDINGS Alignment: Slight grade 1 retrolisthesis at C2-C3, C3-C4 and C4-C5. Skull base and vertebrae: The basion-dental and atlanto-dental intervals are maintained.No evidence of acute fracture to the cervical spine. Soft tissues and spinal canal: No prevertebral fluid or swelling. No visible canal hematoma. Disc levels: Cervical spondylosis. Mild-to-moderate disc space narrowing at C5-C6. No more than mild disc space narrowing at the remaining levels. Multilevel disc bulges/central disc protrusions. No appreciable high-grade spinal canal stenosis. At C5-C6, there is bilateral uncovertebral hypertrophy with bilateral bony neural foraminal narrowing (mild/moderate right, mild left). Upper chest: No consolidation within the imaged lung apices. No visible pneumothorax. IMPRESSION: CT head: 1. No evidence of an acute intracranial abnormality. 2. Small irregular hyperdense focus within  the right parietal scalp, which may reflect a hematoma given the provided history. CT cervical spine: 1. No evidence of acute fracture to the cervical spine. 2. Mild grade 1 retrolisthesis at C2-C3, C3-C4 and C4-C5. 3. Cervical spondylosis as described. Electronically Signed   By: Jackey Loge D.O.   On: 09/16/2022 08:27   CT Cervical Spine Wo Contrast  Result Date: 09/16/2022 CLINICAL DATA:  Provided history: Head trauma, moderate/severe. Neck trauma, midline tenderness. Pain in head, shoulders, low back and bilateral feet. EXAM: CT HEAD WITHOUT CONTRAST CT CERVICAL SPINE WITHOUT CONTRAST TECHNIQUE: Multidetector CT imaging of the head and cervical spine was performed following the standard protocol without intravenous contrast. Multiplanar CT image reconstructions of the cervical spine were also generated. RADIATION DOSE REDUCTION: This exam was performed according to the departmental dose-optimization program which includes automated exposure control, adjustment of the mA and/or kV according to patient size and/or use of iterative reconstruction technique. COMPARISON:  No pertinent prior exams available for comparison. FINDINGS: CT HEAD FINDINGS Brain: Cerebral volume is normal. There is no acute intracranial hemorrhage. No demarcated cortical infarct. No extra-axial fluid collection. No evidence of an intracranial mass. No  midline shift. Vascular: No hyperdense vessel. Skull: No fracture or aggressive osseous lesion. Sinuses/Orbits: No mass or acute finding within the imaged orbits. Minimal mucosal thickening within the right maxillary sinus. Other: Small irregular hyperdense focus within the right parietal scalp, which may reflect a hematoma given the provided history. CT CERVICAL SPINE FINDINGS Alignment: Slight grade 1 retrolisthesis at C2-C3, C3-C4 and C4-C5. Skull base and vertebrae: The basion-dental and atlanto-dental intervals are maintained.No evidence of acute fracture to the cervical spine. Soft  tissues and spinal canal: No prevertebral fluid or swelling. No visible canal hematoma. Disc levels: Cervical spondylosis. Mild-to-moderate disc space narrowing at C5-C6. No more than mild disc space narrowing at the remaining levels. Multilevel disc bulges/central disc protrusions. No appreciable high-grade spinal canal stenosis. At C5-C6, there is bilateral uncovertebral hypertrophy with bilateral bony neural foraminal narrowing (mild/moderate right, mild left). Upper chest: No consolidation within the imaged lung apices. No visible pneumothorax. IMPRESSION: CT head: 1. No evidence of an acute intracranial abnormality. 2. Small irregular hyperdense focus within the right parietal scalp, which may reflect a hematoma given the provided history. CT cervical spine: 1. No evidence of acute fracture to the cervical spine. 2. Mild grade 1 retrolisthesis at C2-C3, C3-C4 and C4-C5. 3. Cervical spondylosis as described. Electronically Signed   By: Jackey Loge D.O.   On: 09/16/2022 08:27    Procedures Procedures    Medications Ordered in ED Medications - No data to display  ED Course/ Medical Decision Making/ A&P                             Medical Decision Making Amount and/or Complexity of Data Reviewed Radiology: ordered.   Patient presents today with complaints of head injury with positive LOC yesterday.  She is afebrile, nontoxic-appearing, and in no acute distress with reassuring vital signs.  She is also alert and oriented and neurologically intact without focal deficits.  Complaining of head and neck pain.  Physical exam reveals no Battle sign or raccoon eyes.  Mild midline cervical spine tenderness with associated bilateral muscle tenderness.  No other focal bony tenderness.  CT imaging of the head and neck obtained which has resulted and reveals  CT head:   1. No evidence of an acute intracranial abnormality. 2. Small irregular hyperdense focus within the right parietal scalp, which may  reflect a hematoma given the provided history.   CT cervical spine:   1. No evidence of acute fracture to the cervical spine. 2. Mild grade 1 retrolisthesis at C2-C3, C3-C4 and C4-C5. 3. Cervical spondylosis as described.  I have personally reviewed and interpreted this imaging and agree with radiology interpretation.  Discussed patient's normal results, given concussion precautions as well.  Recommend RICE and Tylenol/ibuprofen as needed for pain. Evaluation and diagnostic testing in the emergency department does not suggest an emergent condition requiring admission or immediate intervention beyond what has been performed at this time.  Plan for discharge with close PCP follow-up.  Patient is understanding and amenable with plan, educated on red flag symptoms that would prompt immediate return.  Patient discharged in stable condition.   Final Clinical Impression(s) / ED Diagnoses Final diagnoses:  Injury of head, initial encounter    Rx / DC Orders ED Discharge Orders     None     An After Visit Summary was printed and given to the patient.     Silva Bandy, PA-C 09/16/22 [phone]  Wynetta Fines, MD 09/16/22 [phone]

## 2022-09-16 NOTE — Discharge Instructions (Signed)
As we discussed, your workup in the ER today was reassuring for acute findings.  CT imaging of your head and neck did not reveal any emergent concerns.  You could potentially have a concussion which would not show up on a CT scan.  For this you should avoid bright lights, loud music, screen time, and contact sports while you are symptomatic.  Please get plenty of rest, place ice on areas that are sore and take Tylenol/ibuprofen as needed for pain.  Follow-up with your primary care doctor as needed.  Return if development of any new or worsening symptoms.

## 2023-08-09 ENCOUNTER — Inpatient Hospital Stay (HOSPITAL_COMMUNITY)
Admission: EM | Admit: 2023-08-09 | Discharge: 2023-08-09 | Disposition: A | Discharge status: Home / Self Care | Attending: Obstetrics & Gynecology | Admitting: Obstetrics & Gynecology

## 2023-08-09 ENCOUNTER — Other Ambulatory Visit: Payer: Self-pay

## 2023-08-09 ENCOUNTER — Encounter (HOSPITAL_COMMUNITY): Payer: Self-pay | Admitting: Emergency Medicine

## 2023-08-09 DIAGNOSIS — Z789 Other specified health status: Secondary | ICD-10-CM

## 2023-08-09 DIAGNOSIS — O200 Threatened abortion: Secondary | ICD-10-CM | POA: Diagnosis not present

## 2023-08-09 DIAGNOSIS — N926 Irregular menstruation, unspecified: Secondary | ICD-10-CM

## 2023-08-09 DIAGNOSIS — N920 Excessive and frequent menstruation with regular cycle: Secondary | ICD-10-CM | POA: Diagnosis not present

## 2023-08-09 LAB — HCG, QUANTITATIVE, PREGNANCY: hCG, Beta Chain, Quant, S: <1 m[IU]/mL (ref <5)

## 2023-08-09 LAB — POCT PREGNANCY, URINE: Preg Test, Ur: NEGATIVE

## 2023-08-09 NOTE — ED Notes (Signed)
 Patient transported to MAU

## 2023-08-09 NOTE — ED Triage Notes (Signed)
 Pt here from home with c/o possible miscarriage , pt has not had a period since march then started bleeding 2 days ago passing some clots

## 2023-08-09 NOTE — ED Notes (Signed)
 Extra Blue, Red & Lav drawn

## 2023-08-09 NOTE — MAU Note (Signed)
 MAU Triage Note  [name] is a 38 y.o. at Unknown here in MAU reporting: she's had two missed cycles, hasn't taken a HPT.  Reports she's had light VB with two blood clots two days ago., but currently not bleeding.  Denies pain.  LMP: 06/05/2023 Onset of complaint: 2 days ago Pain score: 0 Vitals:   08/09/23 0852  BP: 120/81  Pulse: 61  Resp: 16  Temp: 98.2 F (36.8 C)  SpO2: 100%     FHT: NA  Lab orders placed from triage: UPT

## 2023-08-09 NOTE — ED Provider Notes (Signed)
 [place] EMERGENCY DEPARTMENT AT [facility] Provider Note   CSN: [id number] Arrival date & time: 08/09/23  4098     History  Chief Complaint  Patient presents with   Threatened Miscarriage    ESMARIE PADRICK is a 38 y.o. female.  38 year old female G4P2 with a history of schizophrenia and polysubstance abuse who presents emergency department with vaginal bleeding.  Patient reports that her last period was March 2.  Says that 2 days ago she has had some light bleeding with occasional clots.  Says that she has not been having to go through multiple pads per day.  Is worried about a possible miscarriage.        Home Medications Prior to Admission medications   Medication Sig Start Date End Date Taking? Authorizing Provider  albuterol (PROVENTIL HFA;VENTOLIN HFA) 108 (90 Base) MCG/ACT inhaler Inhale 1-2 puffs into the lungs every 6 (six) hours as needed for wheezing or shortness of breath.    [provider]  cephALEXin  (KEFLEX ) 500 MG capsule Take 1 capsule (500 mg total) by mouth 4 (four) times daily. 11/20/20   Horton, Sidra Dredge, DO  ibuprofen  (ADVIL ,MOTRIN ) 600 MG tablet Take 1 tablet (600 mg total) by mouth every 6 (six) hours as needed. Patient not taking: Reported on 11/30/2017 06/25/17   Constant, Peggy, MD  tetrahydrozoline 0.05 % ophthalmic solution Place 1 drop into both eyes 3 (three) times daily as needed (dry eyes).    [provider]      Allergies    Penicillins    Review of Systems   Review of Systems  Physical Exam Updated Vital Signs BP 120/81   Pulse 61   Temp 98.2 F (36.8 C)   Resp 16   SpO2 100%  Physical Exam Abdominal:     General: Abdomen is flat. There is no distension.     Palpations: Abdomen is soft. There is no mass.     Tenderness: There is no abdominal tenderness. There is no guarding.     ED Results / Procedures / Treatments   Labs (all labs ordered are listed, but only abnormal results are  displayed) Labs Reviewed  HCG, QUANTITATIVE, PREGNANCY    EKG None  Radiology No results found.  Procedures Procedures    Medications Ordered in ED Medications - No data to display  ED Course/ Medical Decision Making/ A&P                                 Medical Decision Making Amount and/or Complexity of Data Reviewed Labs: ordered.   QUINTASIA ROKES is a 38 y.o. female with comorbidities that complicate the patient evaluation including G4P2 with a history of schizophrenia and polysubstance abuse who presents emergency department with vaginal bleeding.   Initial Ddx:  IUP, miscarriage, ectopic pregnancy, irregular menses  MDM/Course:  Patient presents emergency department due to concerns for possible miscarriage.  Had some vaginal spotting with passage of clots after she has not had a menstrual period since March 2.  On exam is overall well-appearing and hemodynamically stable.  No significant abdominal tenderness to palpation.  Had hCG quantitative sent from the emergency department.  Did discuss with MAU APP who has accepted the patient up to the MAU for additional evaluation.  This patient presents to the ED for concern of complaints listed in HPI, this involves an extensive number of treatment options, and is a complaint that  carries with it a high risk of complications and morbidity. Disposition including potential need for admission considered.   Dispo: MAU  Records reviewed Outpatient Clinic Notes Consults:  MAU  Portions of this note were generated with Dragon dictation software. Dictation errors may occur despite best attempts at proofreading.     Final Clinical Impression(s) / ED Diagnoses Final diagnoses:  Spotting  Missed period    Rx / DC Orders ED Discharge Orders     None         Ninetta Basket, MD 08/09/23 1005

## 2023-08-09 NOTE — Discharge Instructions (Signed)
[place] for [employer] at [facility]    Phone: [place] for [employer] at [place]   Phone: [place] for [employer] at [place]  Phone: [place] for [employer] at [facility]  Phone: [place] for [employer] at [place]  Phone: [place] for [place] at [facility]   Phone: [facility]       Phone: [phone]  [facility]    Phone: [phone]   [facility]    Phone: [phone]  [facility]    Phone: [place]    Phone: [phone]  [place] [facility]       Phone: [phone]   [place] [facility]  Phone: [place]    Phone: [phone]  [facility]   Phone: [phone]  [facility]      Phone: [phone]  [facility]    Phone: [phone]

## 2023-08-09 NOTE — ED Notes (Signed)
 Extra Blue, LG & Lav drawn

## 2023-08-09 NOTE — MAU Provider Note (Signed)
 S [name] is a 38 y.o. [id number] patient who presents to MAU today with complaint of missing her menses in April. Reports last normal menses in March and then she had some spotting on 08/06/23 and passed to clots. HCG quant was done in ED  with neg UPT here in MAU. She is having no abdominal pain, fever, or chills.  Patient denies heavy vaginal bleeding and denies any home UPT's that were positive. She denies any urinary discomfort or vaginal burning/itching or irration.   She was initially evaluated in the ED with a benign abdominal exam and then transferred to MAU for further evaluation d/t possibility of pregnancy. Patient reports that she is adequate gyn follow up and declines further examination. She reports that she was just concerned "possible pregnant and having a miscarriage"   O BP (!) 97/42 (BP Location: Right Arm)   Pulse 66   Temp 97.8 F (36.6 C) (Oral)   Resp 18   Ht 5\' 8"  (1.727 m)   Wt 62.2 kg   LMP 06/05/2023   SpO2 98%   BMI 20.85 kg/m  Physical Exam Vitals and nursing note reviewed.  Constitutional:      General: She is not in acute distress.    Appearance: Normal appearance. She is not ill-appearing.  HENT:     Head: Normocephalic.     Nose: Nose normal.     Mouth/Throat:     Mouth: Mucous membranes are moist.  Cardiovascular:     Rate and Rhythm: Normal rate and regular rhythm.  Pulmonary:     Effort: Pulmonary effort is normal.     Breath sounds: Normal breath sounds.  Abdominal:     Palpations: Abdomen is soft.  Musculoskeletal:        General: Normal range of motion.     Cervical back: Normal range of motion.  Skin:    General: Skin is warm.  Neurological:     Mental Status: She is alert and oriented to person, place, and time.  Psychiatric:        Mood and Affect: Mood normal.        Behavior: Behavior normal.     MDM  Moderate  - R/O pregnancy - Negative UPT and hCG quant  <1 - Patient declined pelvic exam or further  evaluation of vaginal bleeding  - Likely menses  - Outpatient gyn care referrals given    Orders Placed This Encounter  Procedures   hCG, quantitative, pregnancy    Standing Status:   Standing    Number of Occurrences:   1   Pregnancy, urine POC    Standing Status:   Standing    Number of Occurrences:   1   Discharge patient Discharge disposition: 01-Home or Self Care; Discharge patient date: 08/09/2023    Standing Status:   Standing    Number of Occurrences:   1    Discharge disposition:   01-Home or Self Care [1]    Discharge patient date:   08/09/2023     I have reviewed the patient chart and performed the physical exam.  A/P as described below.  Counseling and education provided and patient agreeable  with plan as described below. Verbalized understanding.    ASSESSMENT Medical screening exam complete  1. Spotting (Primary)  2. Missed period  3. Irregular menses  4. Not currently pregnant   PLAN  Discharge from MAU in stable condition  See AVS for full description of educational information and  instructions provided to the patient at time of discharge  List of options for follow-up given   Warning signs for worsening condition that would warrant emergency follow-up discussed Patient may return to MAU as needed   Cherlynn Cornfield, NP 08/10/2023 11:18 AM
